# Patient Record
Sex: Female | Born: 1935 | Race: White | Hispanic: No | Smoking: Never smoker
Health system: Southern US, Community
[De-identification: ages and names within clinical notes are randomized; demographics above are authoritative.]

## PROBLEM LIST (undated history)

## (undated) DIAGNOSIS — N189 Chronic kidney disease, unspecified: Secondary | ICD-10-CM

## (undated) DIAGNOSIS — I959 Hypotension, unspecified: Secondary | ICD-10-CM

## (undated) DIAGNOSIS — I519 Heart disease, unspecified: Secondary | ICD-10-CM

## (undated) DIAGNOSIS — E785 Hyperlipidemia, unspecified: Secondary | ICD-10-CM

## (undated) DIAGNOSIS — N179 Acute kidney failure, unspecified: Secondary | ICD-10-CM

## (undated) DIAGNOSIS — Z780 Asymptomatic menopausal state: Secondary | ICD-10-CM

## (undated) DIAGNOSIS — I509 Heart failure, unspecified: Secondary | ICD-10-CM

## (undated) DIAGNOSIS — J302 Other seasonal allergic rhinitis: Secondary | ICD-10-CM

## (undated) DIAGNOSIS — E119 Type 2 diabetes mellitus without complications: Secondary | ICD-10-CM

## (undated) DIAGNOSIS — F419 Anxiety disorder, unspecified: Secondary | ICD-10-CM

## (undated) DIAGNOSIS — I10 Essential (primary) hypertension: Secondary | ICD-10-CM

## (undated) DIAGNOSIS — M797 Fibromyalgia: Secondary | ICD-10-CM

## (undated) DIAGNOSIS — G4700 Insomnia, unspecified: Secondary | ICD-10-CM

## (undated) DIAGNOSIS — I2510 Atherosclerotic heart disease of native coronary artery without angina pectoris: Secondary | ICD-10-CM

## (undated) DIAGNOSIS — N905 Atrophy of vulva: Secondary | ICD-10-CM

## (undated) DIAGNOSIS — R609 Edema, unspecified: Secondary | ICD-10-CM

## (undated) DIAGNOSIS — R634 Abnormal weight loss: Secondary | ICD-10-CM

## (undated) DIAGNOSIS — I5043 Acute on chronic combined systolic (congestive) and diastolic (congestive) heart failure: Secondary | ICD-10-CM

## (undated) DIAGNOSIS — I219 Acute myocardial infarction, unspecified: Secondary | ICD-10-CM

## (undated) DIAGNOSIS — F32A Depression, unspecified: Secondary | ICD-10-CM

## (undated) DIAGNOSIS — G43909 Migraine, unspecified, not intractable, without status migrainosus: Secondary | ICD-10-CM

## (undated) DIAGNOSIS — F329 Major depressive disorder, single episode, unspecified: Secondary | ICD-10-CM

## (undated) DIAGNOSIS — D649 Anemia, unspecified: Secondary | ICD-10-CM

## (undated) HISTORY — PX: CARDIAC CATHETERIZATION: SHX172

## (undated) HISTORY — DX: Asymptomatic menopausal state: Z78.0

## (undated) HISTORY — DX: Anemia, unspecified: D64.9

## (undated) HISTORY — DX: Hypotension, unspecified: I95.9

## (undated) HISTORY — DX: Acute kidney failure, unspecified: N17.9

## (undated) HISTORY — DX: Chronic kidney disease, unspecified: N18.9

## (undated) HISTORY — DX: Heart disease, unspecified: I51.9

## (undated) HISTORY — DX: Atrophy of vulva: N90.5

## (undated) HISTORY — DX: Edema, unspecified: R60.9

## (undated) HISTORY — DX: Other seasonal allergic rhinitis: J30.2

## (undated) HISTORY — DX: Abnormal weight loss: R63.4

## (undated) HISTORY — DX: Acute on chronic combined systolic (congestive) and diastolic (congestive) heart failure: I50.43

## (undated) HISTORY — PX: CORONARY ANGIOPLASTY: SHX604

## (undated) HISTORY — PX: CATARACT EXTRACTION W/ INTRAOCULAR LENS  IMPLANT, BILATERAL: SHX1307

## (undated) HISTORY — DX: Insomnia, unspecified: G47.00

---

## 1975-04-11 HISTORY — PX: VAGINAL HYSTERECTOMY: SUR661

## 1986-04-10 HISTORY — PX: CHOLECYSTECTOMY OPEN: SUR202

## 2004-04-10 HISTORY — PX: CORONARY ARTERY BYPASS GRAFT: SHX141

## 2015-01-19 LAB — CBC AND DIFFERENTIAL
HEMATOCRIT: 36 % (ref 36–46)
HEMOGLOBIN: 12 g/dL (ref 12.0–16.0)
PLATELETS: 183 10*3/uL (ref 150–399)
WBC: 8.9 10*3/mL

## 2015-01-19 LAB — BASIC METABOLIC PANEL
BUN: 36 mg/dL — AB (ref 4–21)
CREATININE: 1.2 mg/dL — AB (ref 0.5–1.1)
POTASSIUM: 4.5 mmol/L (ref 3.4–5.3)
Potassium: 4.5 mmol/L (ref 3.4–5.3)
Potassium: 4.5 mmol/L (ref 3.4–5.3)
Sodium: 136 mmol/L — AB (ref 137–147)

## 2015-01-19 LAB — HEMOGLOBIN A1C
HEMOGLOBIN A1C: 8.6
Hemoglobin A1C: 8.6

## 2015-04-21 ENCOUNTER — Other Ambulatory Visit: Payer: Self-pay

## 2015-04-21 ENCOUNTER — Emergency Department (HOSPITAL_COMMUNITY): Admit: 2015-04-21 | Discharge: 2015-04-21 | Disposition: A | Discharge status: Home / Self Care | Payer: Medicare Other

## 2015-04-21 ENCOUNTER — Inpatient Hospital Stay (HOSPITAL_COMMUNITY)
Admission: EM | Admit: 2015-04-21 | Discharge: 2015-05-06 | DRG: 291 | Disposition: A | Discharge status: Hospice | Payer: Medicare Other | Attending: Internal Medicine | Admitting: Internal Medicine

## 2015-04-21 ENCOUNTER — Encounter (HOSPITAL_COMMUNITY): Payer: Self-pay | Admitting: Emergency Medicine

## 2015-04-21 ENCOUNTER — Emergency Department (HOSPITAL_COMMUNITY): Payer: Medicare Other

## 2015-04-21 DIAGNOSIS — E876 Hypokalemia: Secondary | ICD-10-CM | POA: Insufficient documentation

## 2015-04-21 DIAGNOSIS — E785 Hyperlipidemia, unspecified: Secondary | ICD-10-CM | POA: Diagnosis present

## 2015-04-21 DIAGNOSIS — E119 Type 2 diabetes mellitus without complications: Secondary | ICD-10-CM

## 2015-04-21 DIAGNOSIS — E871 Hypo-osmolality and hyponatremia: Secondary | ICD-10-CM | POA: Diagnosis not present

## 2015-04-21 DIAGNOSIS — R52 Pain, unspecified: Secondary | ICD-10-CM | POA: Insufficient documentation

## 2015-04-21 DIAGNOSIS — E1122 Type 2 diabetes mellitus with diabetic chronic kidney disease: Secondary | ICD-10-CM | POA: Diagnosis present

## 2015-04-21 DIAGNOSIS — Z794 Long term (current) use of insulin: Secondary | ICD-10-CM

## 2015-04-21 DIAGNOSIS — I472 Ventricular tachycardia: Secondary | ICD-10-CM | POA: Diagnosis not present

## 2015-04-21 DIAGNOSIS — Z66 Do not resuscitate: Secondary | ICD-10-CM | POA: Diagnosis present

## 2015-04-21 DIAGNOSIS — M25551 Pain in right hip: Secondary | ICD-10-CM | POA: Diagnosis present

## 2015-04-21 DIAGNOSIS — N189 Chronic kidney disease, unspecified: Secondary | ICD-10-CM

## 2015-04-21 DIAGNOSIS — N289 Disorder of kidney and ureter, unspecified: Secondary | ICD-10-CM | POA: Diagnosis present

## 2015-04-21 DIAGNOSIS — I5043 Acute on chronic combined systolic (congestive) and diastolic (congestive) heart failure: Secondary | ICD-10-CM | POA: Diagnosis present

## 2015-04-21 DIAGNOSIS — I5023 Acute on chronic systolic (congestive) heart failure: Secondary | ICD-10-CM | POA: Insufficient documentation

## 2015-04-21 DIAGNOSIS — I872 Venous insufficiency (chronic) (peripheral): Secondary | ICD-10-CM | POA: Diagnosis present

## 2015-04-21 DIAGNOSIS — D696 Thrombocytopenia, unspecified: Secondary | ICD-10-CM | POA: Diagnosis present

## 2015-04-21 DIAGNOSIS — F329 Major depressive disorder, single episode, unspecified: Secondary | ICD-10-CM | POA: Diagnosis present

## 2015-04-21 DIAGNOSIS — K7460 Unspecified cirrhosis of liver: Secondary | ICD-10-CM | POA: Diagnosis present

## 2015-04-21 DIAGNOSIS — I248 Other forms of acute ischemic heart disease: Secondary | ICD-10-CM | POA: Diagnosis present

## 2015-04-21 DIAGNOSIS — G8929 Other chronic pain: Secondary | ICD-10-CM | POA: Diagnosis present

## 2015-04-21 DIAGNOSIS — N179 Acute kidney failure, unspecified: Secondary | ICD-10-CM | POA: Diagnosis present

## 2015-04-21 DIAGNOSIS — I2510 Atherosclerotic heart disease of native coronary artery without angina pectoris: Secondary | ICD-10-CM | POA: Diagnosis present

## 2015-04-21 DIAGNOSIS — Z7189 Other specified counseling: Secondary | ICD-10-CM | POA: Diagnosis not present

## 2015-04-21 DIAGNOSIS — I255 Ischemic cardiomyopathy: Secondary | ICD-10-CM | POA: Diagnosis present

## 2015-04-21 DIAGNOSIS — M79604 Pain in right leg: Secondary | ICD-10-CM

## 2015-04-21 DIAGNOSIS — R059 Cough, unspecified: Secondary | ICD-10-CM

## 2015-04-21 DIAGNOSIS — I130 Hypertensive heart and chronic kidney disease with heart failure and stage 1 through stage 4 chronic kidney disease, or unspecified chronic kidney disease: Secondary | ICD-10-CM | POA: Diagnosis present

## 2015-04-21 DIAGNOSIS — I252 Old myocardial infarction: Secondary | ICD-10-CM

## 2015-04-21 DIAGNOSIS — N183 Chronic kidney disease, stage 3 (moderate): Secondary | ICD-10-CM | POA: Diagnosis present

## 2015-04-21 DIAGNOSIS — Z951 Presence of aortocoronary bypass graft: Secondary | ICD-10-CM

## 2015-04-21 DIAGNOSIS — M79606 Pain in leg, unspecified: Secondary | ICD-10-CM | POA: Diagnosis present

## 2015-04-21 DIAGNOSIS — I959 Hypotension, unspecified: Secondary | ICD-10-CM | POA: Diagnosis not present

## 2015-04-21 DIAGNOSIS — R0600 Dyspnea, unspecified: Secondary | ICD-10-CM | POA: Diagnosis not present

## 2015-04-21 DIAGNOSIS — Z7982 Long term (current) use of aspirin: Secondary | ICD-10-CM | POA: Diagnosis not present

## 2015-04-21 DIAGNOSIS — R778 Other specified abnormalities of plasma proteins: Secondary | ICD-10-CM

## 2015-04-21 DIAGNOSIS — Z515 Encounter for palliative care: Secondary | ICD-10-CM | POA: Diagnosis present

## 2015-04-21 DIAGNOSIS — K5900 Constipation, unspecified: Secondary | ICD-10-CM | POA: Diagnosis present

## 2015-04-21 DIAGNOSIS — I509 Heart failure, unspecified: Secondary | ICD-10-CM | POA: Diagnosis not present

## 2015-04-21 DIAGNOSIS — R627 Adult failure to thrive: Secondary | ICD-10-CM | POA: Diagnosis not present

## 2015-04-21 DIAGNOSIS — K5909 Other constipation: Secondary | ICD-10-CM | POA: Diagnosis not present

## 2015-04-21 DIAGNOSIS — E8809 Other disorders of plasma-protein metabolism, not elsewhere classified: Secondary | ICD-10-CM | POA: Diagnosis present

## 2015-04-21 DIAGNOSIS — M7989 Other specified soft tissue disorders: Secondary | ICD-10-CM | POA: Diagnosis present

## 2015-04-21 DIAGNOSIS — R040 Epistaxis: Secondary | ICD-10-CM | POA: Diagnosis not present

## 2015-04-21 DIAGNOSIS — R05 Cough: Secondary | ICD-10-CM

## 2015-04-21 DIAGNOSIS — R600 Localized edema: Secondary | ICD-10-CM | POA: Diagnosis not present

## 2015-04-21 DIAGNOSIS — N184 Chronic kidney disease, stage 4 (severe): Secondary | ICD-10-CM | POA: Diagnosis not present

## 2015-04-21 DIAGNOSIS — R7989 Other specified abnormal findings of blood chemistry: Secondary | ICD-10-CM

## 2015-04-21 HISTORY — DX: Migraine, unspecified, not intractable, without status migrainosus: G43.909

## 2015-04-21 HISTORY — DX: Heart failure, unspecified: I50.9

## 2015-04-21 HISTORY — DX: Atherosclerotic heart disease of native coronary artery without angina pectoris: I25.10

## 2015-04-21 HISTORY — DX: Essential (primary) hypertension: I10

## 2015-04-21 HISTORY — DX: Type 2 diabetes mellitus without complications: E11.9

## 2015-04-21 HISTORY — DX: Major depressive disorder, single episode, unspecified: F32.9

## 2015-04-21 HISTORY — DX: Acute myocardial infarction, unspecified: I21.9

## 2015-04-21 HISTORY — DX: Fibromyalgia: M79.7

## 2015-04-21 HISTORY — DX: Depression, unspecified: F32.A

## 2015-04-21 HISTORY — DX: Anxiety disorder, unspecified: F41.9

## 2015-04-21 HISTORY — DX: Hyperlipidemia, unspecified: E78.5

## 2015-04-21 LAB — CBC WITH DIFFERENTIAL/PLATELET
BASOS ABS: 0 10*3/uL (ref 0.0–0.1)
BASOS PCT: 0 %
EOS ABS: 0.2 10*3/uL (ref 0.0–0.7)
Eosinophils Relative: 2 %
HEMATOCRIT: 40.9 % (ref 36.0–46.0)
HEMOGLOBIN: 12.9 g/dL (ref 12.0–15.0)
Lymphocytes Relative: 20 %
Lymphs Abs: 1.5 10*3/uL (ref 0.7–4.0)
MCH: 28.2 pg (ref 26.0–34.0)
MCHC: 31.5 g/dL (ref 30.0–36.0)
MCV: 89.5 fL (ref 78.0–100.0)
MONO ABS: 0.5 10*3/uL (ref 0.1–1.0)
Monocytes Relative: 7 %
NEUTROS ABS: 5.5 10*3/uL (ref 1.7–7.7)
NEUTROS PCT: 71 %
Platelets: 172 10*3/uL (ref 150–400)
RBC: 4.57 MIL/uL (ref 3.87–5.11)
RDW: 14.8 % (ref 11.5–15.5)
WBC: 7.7 10*3/uL (ref 4.0–10.5)

## 2015-04-21 LAB — BASIC METABOLIC PANEL
ANION GAP: 10 (ref 5–15)
BUN: 49 mg/dL — ABNORMAL HIGH (ref 6–20)
CALCIUM: 9.2 mg/dL (ref 8.9–10.3)
CO2: 22 mmol/L (ref 22–32)
CREATININE: 1.43 mg/dL — AB (ref 0.44–1.00)
Chloride: 105 mmol/L (ref 101–111)
GFR, EST AFRICAN AMERICAN: 39 mL/min — AB (ref >60)
GFR, EST NON AFRICAN AMERICAN: 33 mL/min — AB (ref >60)
Glucose, Bld: 247 mg/dL — ABNORMAL HIGH (ref 65–99)
Potassium: 4.4 mmol/L (ref 3.5–5.1)
SODIUM: 137 mmol/L (ref 135–145)

## 2015-04-21 LAB — I-STAT TROPONIN, ED: TROPONIN I, POC: 0.13 ng/mL — AB (ref 0.00–0.08)

## 2015-04-21 LAB — GLUCOSE, CAPILLARY: GLUCOSE-CAPILLARY: 219 mg/dL — AB (ref 65–99)

## 2015-04-21 LAB — BRAIN NATRIURETIC PEPTIDE

## 2015-04-21 MED ORDER — INSULIN GLARGINE 100 UNIT/ML ~~LOC~~ SOLN
12.0000 [IU] | Freq: Every morning | SUBCUTANEOUS | Status: DC
  Administered 2015-04-22 – 2015-05-05 (×13): 12 [IU] via SUBCUTANEOUS
  Filled 2015-04-21 (×17): qty 0.12

## 2015-04-21 MED ORDER — ACETAMINOPHEN 325 MG PO TABS
650.0000 mg | ORAL_TABLET | ORAL | Status: DC | PRN
  Administered 2015-04-22 – 2015-04-28 (×6): 650 mg via ORAL
  Filled 2015-04-21 (×5): qty 2

## 2015-04-21 MED ORDER — SODIUM CHLORIDE 0.9 % IV SOLN: 250.0000 mL | INTRAVENOUS | Status: DC | PRN

## 2015-04-21 MED ORDER — ACETAMINOPHEN 500 MG PO TABS: 1000.0000 mg | ORAL_TABLET | Freq: Four times a day (QID) | ORAL | Status: DC | PRN

## 2015-04-21 MED ORDER — CITALOPRAM HYDROBROMIDE 10 MG PO TABS
10.0000 mg | ORAL_TABLET | Freq: Every day | ORAL | Status: DC
  Administered 2015-04-21 – 2015-05-05 (×14): 10 mg via ORAL
  Filled 2015-04-21 (×15): qty 1

## 2015-04-21 MED ORDER — SODIUM CHLORIDE 0.9 % IJ SOLN
3.0000 mL | Freq: Two times a day (BID) | INTRAMUSCULAR | Status: DC
  Administered 2015-04-21 – 2015-04-28 (×13): 3 mL via INTRAVENOUS

## 2015-04-21 MED ORDER — FERROUS SULFATE 325 (65 FE) MG PO TABS
325.0000 mg | ORAL_TABLET | Freq: Two times a day (BID) | ORAL | Status: DC
  Administered 2015-04-21 – 2015-05-06 (×28): 325 mg via ORAL
  Filled 2015-04-21 (×30): qty 1

## 2015-04-21 MED ORDER — ONDANSETRON HCL 4 MG/2ML IJ SOLN
4.0000 mg | Freq: Four times a day (QID) | INTRAMUSCULAR | Status: DC | PRN
  Administered 2015-04-21 – 2015-05-04 (×8): 4 mg via INTRAVENOUS
  Filled 2015-04-21 (×7): qty 2

## 2015-04-21 MED ORDER — HEPARIN SODIUM (PORCINE) 5000 UNIT/ML IJ SOLN
5000.0000 [IU] | Freq: Three times a day (TID) | INTRAMUSCULAR | Status: DC
  Administered 2015-04-21 – 2015-04-24 (×10): 5000 [IU] via SUBCUTANEOUS
  Filled 2015-04-21 (×12): qty 1

## 2015-04-21 MED ORDER — FUROSEMIDE 10 MG/ML IJ SOLN
40.0000 mg | Freq: Once | INTRAMUSCULAR | Status: AC
  Administered 2015-04-21: 40 mg via INTRAVENOUS
  Filled 2015-04-21: qty 4

## 2015-04-21 MED ORDER — ASPIRIN EC 81 MG PO TBEC
81.0000 mg | DELAYED_RELEASE_TABLET | Freq: Every day | ORAL | Status: DC
  Administered 2015-04-21 – 2015-05-05 (×14): 81 mg via ORAL
  Filled 2015-04-21 (×15): qty 1

## 2015-04-21 MED ORDER — FUROSEMIDE 10 MG/ML IJ SOLN
40.0000 mg | Freq: Two times a day (BID) | INTRAMUSCULAR | Status: DC
  Administered 2015-04-22 – 2015-04-24 (×6): 40 mg via INTRAVENOUS
  Filled 2015-04-21 (×6): qty 4

## 2015-04-21 MED ORDER — NITROGLYCERIN 2 % TD OINT
1.0000 [in_us] | TOPICAL_OINTMENT | Freq: Once | TRANSDERMAL | Status: AC
  Administered 2015-04-21: 1 [in_us] via TOPICAL
  Filled 2015-04-21: qty 1

## 2015-04-21 MED ORDER — SODIUM CHLORIDE 0.9 % IJ SOLN: 3.0000 mL | INTRAMUSCULAR | Status: DC | PRN

## 2015-04-21 MED ORDER — FENTANYL CITRATE (PF) 100 MCG/2ML IJ SOLN
50.0000 ug | Freq: Once | INTRAMUSCULAR | Status: AC
  Administered 2015-04-21: 50 ug via INTRAVENOUS
  Filled 2015-04-21: qty 2

## 2015-04-21 MED ORDER — ROSUVASTATIN CALCIUM 10 MG PO TABS
10.0000 mg | ORAL_TABLET | Freq: Every evening | ORAL | Status: DC
  Administered 2015-04-21: 10 mg via ORAL
  Filled 2015-04-21: qty 1

## 2015-04-21 MED ORDER — ALIGN 4 MG PO CAPS: 4.0000 mg | ORAL_CAPSULE | Freq: Every day | ORAL | Status: DC

## 2015-04-21 NOTE — ED Provider Notes (Signed)
Patient complains of pain in her right leg below the knee for the past several months.. Today her right leg her to the point where she could not walk. Pain is worse with walking or weightbearing. Her son reports that both legs have been swollen for several months and actually look much improved. The right leg has been present for several months and looks much improved. Patient complained of mild shortness of breath earlier today but denies shortness of breath presently. On exam alert appropriate lungs HEENT exam no facial asymmetry neck supple no JVD no bruits lungs clear auscultation heart regular rate and rhythm abdomen nondistended nontender. Bilateral lower extremities with 2+ pretibial pitting edema. Right lower extremity is reddened below the knee not hot or cold as compared to left leg. Both legs are hairless, skin is thin. DP pulses absent bilaterally. Suspect the patient has chronic vascular insufficiency  Doug SouSam Irianna Gilday, MD 04/22/15 [phone]

## 2015-04-21 NOTE — ED Provider Notes (Signed)
CSN: [id number]     Arrival date & time 04/21/15  1751 History   First MD Initiated Contact with Patient 04/21/15 1805     Chief Complaint  Patient presents with  . Leg Pain     (Consider location/radiation/quality/duration/timing/severity/associated sxs/prior Treatment) HPI Comments: Patient with history of congestive heart failure on Lasix, diabetes, high blood pressure -- presents worsening of her baseline lower extremity swelling and leg pain today. Family notes that her symptoms were much better yesterday and in general, her legs are less swollen now than they have been over the past several weeks. Patient has chronic redness to her right leg. Today her legs hurt to the point where she could not walk. No fever, N/V/D, CP. No reported h/o DVT. Patient sleeps on 1 pillow. Pt reports having worsening SOB today. No change in urination. [place] The onset of this condition was acute on chronic. The course is constant. Aggravating factors: none. Alleviating factors: none.    The history is provided by the patient and a relative.    Past Medical History  Diagnosis Date  . CHF (congestive heart failure) (HCC)   . Diabetes mellitus without complication (HCC)   . Hypertension    History reviewed. No pertinent past surgical history. No family history on file. Social History  Substance Use Topics  . Smoking status: None  . Smokeless tobacco: None  . Alcohol Use: None   OB History    No data available     Review of Systems  Constitutional: Negative for fever.  HENT: Negative for rhinorrhea and sore throat.   Eyes: Negative for redness.  Respiratory: Positive for shortness of breath. Negative for cough.   Cardiovascular: Positive for leg swelling. Negative for chest pain.  Gastrointestinal: Negative for nausea, vomiting, abdominal pain and diarrhea.  Genitourinary: Negative for dysuria.  Musculoskeletal: Positive for myalgias.  Skin: Positive for color  change. Negative for rash.  Neurological: Negative for headaches.    Allergies  Codeine and Lipitor  Home Medications   Prior to Admission medications   Not on File   BP 124/88 mmHg  Pulse 90  Resp 20  SpO2 100%   Physical Exam  Constitutional: She appears well-developed and well-nourished.  HENT:  Head: Normocephalic and atraumatic.  Eyes: Conjunctivae are normal. Right eye exhibits no discharge. Left eye exhibits no discharge.  Neck: Normal range of motion. Neck supple.  Cardiovascular: Normal rate, regular rhythm and normal heart sounds.   No murmur heard. Pulmonary/Chest: Effort normal and breath sounds normal. No respiratory distress. She has no wheezes. She has no rales.  Abdominal: Soft. There is no tenderness.  Musculoskeletal:  Bilateral LE edema, R>L. Erythema of R lower leg.   Neurological: She is alert.  Skin: Skin is warm and dry.  Psychiatric: She has a normal mood and affect.  Nursing note and vitals reviewed.   ED Course  Procedures (including critical care time) Labs Review Labs Reviewed  BASIC METABOLIC PANEL - Abnormal; Notable for the following:    Glucose, Bld 247 (*)    BUN 49 (*)    Creatinine, Ser 1.43 (*)    GFR calc non Af Amer 33 (*)    GFR calc Af Amer 39 (*)    All other components within normal limits  BRAIN NATRIURETIC PEPTIDE - Abnormal; Notable for the following:    B Natriuretic Peptide >4500.0 (*)    All other components within normal limits  I-STAT TROPOININ, ED - Abnormal; Notable  for the following:    Troponin i, poc 0.13 (*)    All other components within normal limits  CBC WITH DIFFERENTIAL/PLATELET  TROPONIN I  TROPONIN I  TROPONIN I  BASIC METABOLIC PANEL    Imaging Review Dg Chest 2 View  04/21/2015  CLINICAL DATA:  Lower extremity swelling and edema for 2 months. EXAM: CHEST  2 VIEW COMPARISON:  None. FINDINGS: Low lung volumes. The lungs are clear wiithout focal pneumonia, edema, pneumothorax or pleural  effusion. Interstitial markings are diffusely coarsened with chronic features. There is pulmonary vascular congestion without overt pulmonary edema. The cardio pericardial silhouette is enlarged. Patient is status post median sternotomy Bones are diffusely demineralized. IMPRESSION: Enlargement of the cardiopericardial silhouette. Cardiomegaly versus pericardial effusion. Vascular congestion without overt edema. Electronically Signed   By: Kennith Center M.D.   On: 04/21/2015 19:28   I have personally reviewed and evaluated these images and lab results as part of my medical decision-making.   EKG Interpretation None       6:24 PM Patient seen and examined. Work-up initiated. Medications ordered.   Vital signs reviewed and are as follows: BP 124/88 mmHg  Pulse 90  Resp 20  SpO2 100%  9:09 PM Labs as above. Admit for CHF exacerbation.   D/w Dr. Ethelda Chick who has seen. Spoke with Dr. Julian Reil who will admit.   ED ECG REPORT   Date: 04/21/2015  Rate: 87  Rhythm: normal sinus rhythm  QRS Axis: left  Intervals: normal  ST/T Wave abnormalities: nonspecific T wave changes  Conduction Disutrbances:none  Narrative Interpretation:   Old EKG Reviewed: none available  I have personally reviewed the EKG tracing and agree with the computerized printout as noted.    MDM   Final diagnoses:  Acute on chronic congestive heart failure, unspecified congestive heart failure type (HCC)  Elevated troponin   Admit.    Renne Crigler, PA-C 04/21/15 2127  Renne Crigler, PA-C 04/21/15 2133  Doug Sou, MD 04/22/15 0040

## 2015-04-21 NOTE — ED Notes (Signed)
Report attempted. RN stated that she was not ready for patient at this time.

## 2015-04-21 NOTE — H&P (Signed)
[facility] History and Physical  Starletta Houchin ZOX:[id number] DOB: 1931/11/02 DOA: 04/21/2015  Referring physician: EDP PCP: No primary care provider on file.   Chief Complaint: Fatigue   HPI: Angela Stuart is a 82 y.o. female with h/o CHF, CABG, DM, HTN.  Patient presents to the ED with several week history of worsening peripheral edema and generalized weakness and fatigue.  This became severe enough today that she could not walk per family.  She recently moved to area from [place].  Symptoms are not being helped by her home Lasix which she takes once a day at noon (dose unknown).  She denies any chest pain, no change in urination.  Review of Systems: Systems reviewed.  As above, otherwise negative  Past Medical History  Diagnosis Date  . CHF (congestive heart failure) (HCC)   . Diabetes mellitus without complication (HCC)   . Hypertension    Past Surgical History  Procedure Laterality Date  . Coronary artery bypass graft     Social History:  has no tobacco, alcohol, and drug history on file.  Allergies  Allergen Reactions  . Codeine Nausea And Vomiting and Other (See Comments)    headaches  . Lipitor [Atorvastatin] Nausea And Vomiting and Other (See Comments)    headaches    No family history on file.   Prior to Admission medications   Medication Sig Start Date End Date Taking? Authorizing Provider  acetaminophen (TYLENOL) 500 MG tablet Take 1,000 mg by mouth every 6 (six) hours as needed for moderate pain.   Yes Historical Provider, MD  aspirin EC 81 MG tablet Take 81 mg by mouth daily.   Yes Historical Provider, MD  citalopram (CELEXA) 10 MG tablet Take 10 mg by mouth daily.   Yes Historical Provider, MD  ferrous sulfate 325 (65 FE) MG EC tablet Take 325 mg by mouth 2 (two) times daily.   Yes Historical Provider, MD  hydrocortisone cream 1 % Apply 1 application topically daily as needed for itching.   Yes Historical Provider, MD  insulin glargine (LANTUS)  100 UNIT/ML injection Inject 12 Units into the skin every morning.   Yes Historical Provider, MD  metFORMIN (GLUCOPHAGE) 500 MG tablet Take 500 mg by mouth 2 (two) times daily with a meal.   Yes Historical Provider, MD  Probiotic Product (ALIGN) 4 MG CAPS Take 4 mg by mouth daily.   Yes Historical Provider, MD  rosuvastatin (CRESTOR) 10 MG tablet Take 10 mg by mouth every evening.   Yes Historical Provider, MD   Physical Exam: Filed Vitals:   04/21/15 2045 04/21/15 2100  BP: 124/88 125/86  Pulse: 90 90  Resp:      BP 125/86 mmHg  Pulse 90  Resp 20  SpO2 99%  General Appearance:    Alert, oriented, no distress, appears stated age  Head:    Normocephalic, atraumatic  Eyes:    PERRL, EOMI, sclera non-icteric        Nose:   Nares without drainage or epistaxis. Mucosa, turbinates normal  Throat:   Moist mucous membranes. Oropharynx without erythema or exudate.  Neck:   Supple. No carotid bruits.  No thyromegaly.  No lymphadenopathy.   Back:     No CVA tenderness, no spinal tenderness  Lungs:     Clear to auscultation bilaterally, without wheezes, rhonchi or rales  Chest wall:    No tenderness to palpitation  Heart:    Regular rate and rhythm without murmurs, gallops, rubs  Abdomen:  Soft, non-tender, nondistended, normal bowel sounds, no organomegaly  Genitalia:    deferred  Rectal:    deferred  Extremities:   No clubbing, cyanosis or edema.  Pulses:   2+ and symmetric all extremities  Skin:   Skin color, texture, turgor normal, no rashes or lesions  Lymph nodes:   Cervical, supraclavicular, and axillary nodes normal  Neurologic:   CNII-XII intact. Normal strength, sensation and reflexes      throughout    Labs on Admission:  Basic Metabolic Panel:  Recent Labs Lab 04/21/15 1938  NA 137  K 4.4  CL 105  CO2 22  GLUCOSE 247*  BUN 49*  CREATININE 1.43*  CALCIUM 9.2   Liver Function Tests: No results for input(s): AST, ALT, ALKPHOS, BILITOT, PROT, ALBUMIN in the last  168 hours. No results for input(s): LIPASE, AMYLASE in the last 168 hours. No results for input(s): AMMONIA in the last 168 hours. CBC:  Recent Labs Lab 04/21/15 1938  WBC 7.7  NEUTROABS 5.5  HGB 12.9  HCT 40.9  MCV 89.5  PLT 172   Cardiac Enzymes: No results for input(s): CKTOTAL, CKMB, CKMBINDEX, TROPONINI in the last 168 hours.  BNP (last 3 results) No results for input(s): PROBNP in the last 8760 hours. CBG: No results for input(s): GLUCAP in the last 168 hours.  Radiological Exams on Admission: Dg Chest 2 View  04/21/2015  CLINICAL DATA:  Lower extremity swelling and edema for 2 months. EXAM: CHEST  2 VIEW COMPARISON:  None. FINDINGS: Low lung volumes. The lungs are clear wiithout focal pneumonia, edema, pneumothorax or pleural effusion. Interstitial markings are diffusely coarsened with chronic features. There is pulmonary vascular congestion without overt pulmonary edema. The cardio pericardial silhouette is enlarged. Patient is status post median sternotomy Bones are diffusely demineralized. IMPRESSION: Enlargement of the cardiopericardial silhouette. Cardiomegaly versus pericardial effusion. Vascular congestion without overt edema. Electronically Signed   By: Kennith CenterEric  Mansell M.D.   On: 04/21/2015 19:28    EKG: Independently reviewed.  Assessment/Plan Principal Problem:   CHF, acute on chronic (HCC) Active Problems:   Renal insufficiency   DM2 (diabetes mellitus, type 2) (HCC)   1. Acute on chronic CHF - unknown wether systolic / diastolic yet 1. CHF pathway 2. Obtaining outside records 3. Serial trops for slight elevation of 0.13, though this is low enough that it is likely related to CHF 4. Tele monitor 5. 2d echo pending 6. Lasix ordered 2. Renal insufficiency - 1. Not ordering ACEi for CHF 2. Monitor closely with diuresis 3. Suspect that there may be an acute component to this as patient is on Metformin at baseline 4. Hold metformin 5. Obtaining  records 3. DM2 - 1. continue home lantus 2. Hold metformin 3. CBG checks AC/HS    Code Status: Full code  Family Communication: Family at bedside Disposition Plan: Admit to inpatient   Time spent: Admit to inpatient  GARDNER, JARED M. [facility] Pager [phone]  If 7AM-7PM, please contact the day team taking care of the patient [email or web address] Password [email or web address] 04/21/2015, 9:33 PM

## 2015-04-21 NOTE — Progress Notes (Signed)
*  Preliminary Results* Right lower extremity venous duplex completed. Study was technically limited due to pitting edema. Visualized veins of the right lower extremity is negative for deep vein thrombosis. Right lower extremity veins exhibit pulsatile flow, suggestive of possible elevated right sided heart pressure. There is no evidence of right Baker's cyst.  04/21/2015 6:54 PM  Gertie FeyMichelle Marlayna Bannister, RVT, RDCS, RDMS

## 2015-04-21 NOTE — ED Notes (Signed)
Per [facility] patient complains of bilateral leg pain.  Patient reports taking a "fluid pill" but could not identify what medication she is prescribed.  Patient received 4 mg Zofran en route.

## 2015-04-22 ENCOUNTER — Encounter (HOSPITAL_COMMUNITY): Payer: Self-pay | Admitting: General Practice

## 2015-04-22 ENCOUNTER — Other Ambulatory Visit (HOSPITAL_COMMUNITY): Payer: Medicare Other

## 2015-04-22 LAB — BASIC METABOLIC PANEL
ANION GAP: 8 (ref 5–15)
BUN: 49 mg/dL — ABNORMAL HIGH (ref 6–20)
CHLORIDE: 107 mmol/L (ref 101–111)
CO2: 23 mmol/L (ref 22–32)
Calcium: 9.2 mg/dL (ref 8.9–10.3)
Creatinine, Ser: 1.53 mg/dL — ABNORMAL HIGH (ref 0.44–1.00)
GFR, EST AFRICAN AMERICAN: 36 mL/min — AB (ref >60)
GFR, EST NON AFRICAN AMERICAN: 31 mL/min — AB (ref >60)
Glucose, Bld: 259 mg/dL — ABNORMAL HIGH (ref 65–99)
POTASSIUM: 4.6 mmol/L (ref 3.5–5.1)
Sodium: 138 mmol/L (ref 135–145)

## 2015-04-22 LAB — GLUCOSE, CAPILLARY
GLUCOSE-CAPILLARY: 184 mg/dL — AB (ref 65–99)
Glucose-Capillary: 222 mg/dL — ABNORMAL HIGH (ref 65–99)

## 2015-04-22 LAB — TROPONIN I
TROPONIN I: 0.15 ng/mL — AB (ref <0.031)
TROPONIN I: 0.16 ng/mL — AB (ref <0.031)
Troponin I: 0.13 ng/mL — ABNORMAL HIGH (ref <0.031)

## 2015-04-22 LAB — MRSA PCR SCREENING: MRSA BY PCR: NEGATIVE

## 2015-04-22 MED ORDER — ROSUVASTATIN CALCIUM 10 MG PO TABS
5.0000 mg | ORAL_TABLET | Freq: Every evening | ORAL | Status: DC
  Administered 2015-04-22 – 2015-05-05 (×14): 5 mg via ORAL
  Filled 2015-04-22 (×15): qty 1

## 2015-04-22 NOTE — Progress Notes (Signed)
[facility]                                                                              Patient Demographics  Angela Stuart, is a 82 y.o. female, DOB - 29-Oct-1931, ZOX:[id number]  Admit date - 04/21/2015   Admitting Physician Angela BowJared M Gardner, DO  Outpatient Primary MD for the patient is GREEN, Angela CurtARTHUR G, MD  LOS - 1   Chief Complaint  Patient presents with  . Leg Pain      HPI on 04/21/2015 by Dr. Lyda PeroneJared Stuart Angela Stuart is a 82 y.o. female with h/o CHF, CABG, DM, HTN. Patient presents to the ED with several week history of worsening peripheral edema and generalized weakness and fatigue. This became severe enough today that she could not walk per family. [place]. Symptoms are not being helped by her home Lasix which she takes once a day at noon (dose unknown).  She denies any chest pain, no change in urination.  Assessment & Plan   Acute CHF exacerbation -Unknown if systolic or diastolic, there is no echo in our system -CXR: Enlargement of cardiopericardial silhouette, cardiomegaly versus pericardial effusion. Vascular congestion without overt edema -Pending echocardiogram -BNP >4500 -Continue lasix -Monitor daily weights, intake/output  Venous insuff. Lower extremity  -RLE appears to have erythema, patient currently afebrile, no leukocytosis. States it has been red for quite some time -Unlikely cellulitis, continue to monitor -LE Doppler: negative for DVT  Elevated troponin -Likely secondary to demand ischemia vs CHF exac -Continue to trend  Acute kidney injury vs CKD -No previous Creatinine in the system -metformin held -Continue to monitor BMP -Will attempt to contact PCP for records  Diabetes mellitus, type 2 -Continue home lantus, ISS, and CBG monitoring -Metformin held  Depression  -Continue Celexa  Hyperlipidemia -Continue statin  Code Status: Full  Family Communication: None at bedside  Disposition  Plan: Admitted  Time Spent in minutes   30 minutes  Procedures  None  Consults   None  DVT Prophylaxis  heparin  Lab Results  Component Value Date   PLT 172 04/21/2015    Medications  Scheduled Meds: . aspirin EC  81 mg Oral Daily  . citalopram  10 mg Oral Daily  . ferrous sulfate  325 mg Oral BID  . furosemide  40 mg Intravenous BID  . heparin  5,000 Units Subcutaneous 3 times per day  . insulin glargine  12 Units Subcutaneous q morning - 10a  . rosuvastatin  10 mg Oral QPM  . sodium chloride  3 mL Intravenous Q12H   Continuous Infusions:  PRN Meds:.sodium chloride, acetaminophen, ondansetron (ZOFRAN) IV, sodium chloride  Antibiotics    Anti-infectives    None      Subjective:   Angela PitmanMary Degrazia seen and examined today.  Patient feels her leg swelling as well as breathing have improved since being admitted. She denies any chest pain at this time. Denies abdominal pain, nausea, vomiting, diarrhea or constipation.    Objective:   Filed Vitals:   04/22/15 0112 04/22/15 0337 04/22/15 0846 04/22/15 1114  BP: 114/52 130/69 100/56 111/57  Pulse: 80 93 85 79  Temp: 97.5  F (36.4 C) 97.5 F (36.4 C) 97.5 F (36.4 C) 97.6 F (36.4 C)  TempSrc: Oral Oral Oral Oral  Resp: 18 20 18 18   Height:      Weight:  72.5 kg (159 lb 13.3 oz)    SpO2: 98% 100% 100% 100%    Wt Readings from Last 3 Encounters:  04/22/15 72.5 kg (159 lb 13.3 oz)     Intake/Output Summary (Last 24 hours) at 04/22/15 1318 Last data filed at 04/22/15 1025  Gross per 24 hour  Intake    600 ml  Output    275 ml  Net    325 ml    Exam  General: Well developed, well nourished, NAD, appears stated age  HEENT: NCAT, mucous membranes moist.   Cardiovascular: S1 S2 auscultated, RRR, no murmurs  Respiratory: Clear to auscultation bilaterally with equal chest rise  Abdomen: Soft, nontender, nondistended, + bowel sounds  Extremities: warm dry without cyanosis clubbing. 2+ edema in LE  B/L  Neuro: AAOx3, nonfocal  Psych: Normal affect and demeanor with intact judgement and insight  Data Review   Micro Results Recent Results (from the past 240 hour(s))  MRSA PCR Screening     Status: None   Collection Time: 04/21/15 10:55 PM  Result Value Ref Range Status   MRSA by PCR NEGATIVE NEGATIVE Final    Comment:        The GeneXpert MRSA Assay (FDA approved for NASAL specimens only), is one component of a comprehensive MRSA colonization surveillance program. It is not intended to diagnose MRSA infection nor to guide or monitor treatment for MRSA infections.     Radiology Reports Dg Chest 2 View  04/21/2015  CLINICAL DATA:  Lower extremity swelling and edema for 2 months. EXAM: CHEST  2 VIEW COMPARISON:  None. FINDINGS: Low lung volumes. The lungs are clear wiithout focal pneumonia, edema, pneumothorax or pleural effusion. Interstitial markings are diffusely coarsened with chronic features. There is pulmonary vascular congestion without overt pulmonary edema. The cardio pericardial silhouette is enlarged. Patient is status post median sternotomy Bones are diffusely demineralized. IMPRESSION: Enlargement of the cardiopericardial silhouette. Cardiomegaly versus pericardial effusion. Vascular congestion without overt edema. Electronically Signed   By: Kennith Center M.D.   On: 04/21/2015 19:28    CBC  Recent Labs Lab 04/21/15 1938  WBC 7.7  HGB 12.9  HCT 40.9  PLT 172  MCV 89.5  MCH 28.2  MCHC 31.5  RDW 14.8  LYMPHSABS 1.5  MONOABS 0.5  EOSABS 0.2  BASOSABS 0.0    Chemistries   Recent Labs Lab 04/21/15 1938 04/22/15 0323  NA 137 138  K 4.4 4.6  CL 105 107  CO2 22 23  GLUCOSE 247* 259*  BUN 49* 49*  CREATININE 1.43* 1.53*  CALCIUM 9.2 9.2   ------------------------------------------------------------------------------------------------------------------ estimated creatinine clearance is 29.4 mL/min (by C-Stuart formula based on Cr of  1.53). ------------------------------------------------------------------------------------------------------------------ No results for input(s): HGBA1C in the last 72 hours. ------------------------------------------------------------------------------------------------------------------ No results for input(s): CHOL, HDL, LDLCALC, TRIG, CHOLHDL, LDLDIRECT in the last 72 hours. ------------------------------------------------------------------------------------------------------------------ No results for input(s): TSH, T4TOTAL, T3FREE, THYROIDAB in the last 72 hours.  Invalid input(s): FREET3 ------------------------------------------------------------------------------------------------------------------ No results for input(s): VITAMINB12, FOLATE, FERRITIN, TIBC, IRON, RETICCTPCT in the last 72 hours.  Coagulation profile No results for input(s): INR, PROTIME in the last 168 hours.  No results for input(s): DDIMER in the last 72 hours.  Cardiac Enzymes  Recent Labs Lab 04/21/15 2334 04/22/15 0323 04/22/15 0935  TROPONINI 0.16*  0.15* 0.13*   ------------------------------------------------------------------------------------------------------------------ Invalid input(s): POCBNP    Kaysi Ourada D.O. on 04/22/2015 at 1:18 PM  Between 7am to 7pm - Pager - [phone]  After 7pm go to [email or web address] - password [email or web address]  And look for the night coverage person covering for me after hours  [facility] Office  [phone]

## 2015-04-22 NOTE — Progress Notes (Signed)
   04/22/15 0900  Clinical Encounter Type  Visited With Health care provider;Patient not available  Visit Type Initial  Referral From Nurse (Admit RN)  [facility] attempted to visit pt; RN in rounds and pt unavailable; If spiritual care support needed please contact Chaplain. Erline LevineMichael I Shaquisha Wynn 9:19 AM

## 2015-04-22 NOTE — Progress Notes (Signed)
Noted that blood sugars greater than 180 mg/dl. Recommend adding Novolog SENSITIVE correction scale TID & HS and check blood sugars TID & HS. Will continue to monitor blood sugars while in hospital. Smith MinceKendra Laira Penninger RN BSN CDE

## 2015-04-22 NOTE — Progress Notes (Signed)
Utilization review completed. Netasha Wehrli, RN, BSN. 

## 2015-04-23 ENCOUNTER — Other Ambulatory Visit (HOSPITAL_COMMUNITY): Payer: Medicare Other

## 2015-04-23 ENCOUNTER — Inpatient Hospital Stay (HOSPITAL_COMMUNITY): Payer: Medicare Other

## 2015-04-23 DIAGNOSIS — R0600 Dyspnea, unspecified: Secondary | ICD-10-CM

## 2015-04-23 DIAGNOSIS — I509 Heart failure, unspecified: Secondary | ICD-10-CM

## 2015-04-23 LAB — GLUCOSE, CAPILLARY
GLUCOSE-CAPILLARY: 107 mg/dL — AB (ref 65–99)
GLUCOSE-CAPILLARY: 149 mg/dL — AB (ref 65–99)
Glucose-Capillary: 86 mg/dL (ref 65–99)
Glucose-Capillary: 187 mg/dL — ABNORMAL HIGH (ref 65–99)

## 2015-04-23 LAB — CBC
HEMATOCRIT: 40.2 % (ref 36.0–46.0)
HEMOGLOBIN: 12.6 g/dL (ref 12.0–15.0)
MCH: 28.2 pg (ref 26.0–34.0)
MCHC: 31.3 g/dL (ref 30.0–36.0)
MCV: 89.9 fL (ref 78.0–100.0)
Platelets: 150 10*3/uL (ref 150–400)
RBC: 4.47 MIL/uL (ref 3.87–5.11)
RDW: 14.9 % (ref 11.5–15.5)
WBC: 7.1 10*3/uL (ref 4.0–10.5)

## 2015-04-23 LAB — BASIC METABOLIC PANEL
ANION GAP: 7 (ref 5–15)
BUN: 49 mg/dL — ABNORMAL HIGH (ref 6–20)
CO2: 23 mmol/L (ref 22–32)
Calcium: 9.1 mg/dL (ref 8.9–10.3)
Chloride: 107 mmol/L (ref 101–111)
Creatinine, Ser: 1.61 mg/dL — ABNORMAL HIGH (ref 0.44–1.00)
GFR calc Af Amer: 33 mL/min — ABNORMAL LOW (ref >60)
GFR, EST NON AFRICAN AMERICAN: 29 mL/min — AB (ref >60)
GLUCOSE: 76 mg/dL (ref 65–99)
POTASSIUM: 3.9 mmol/L (ref 3.5–5.1)
Sodium: 137 mmol/L (ref 135–145)

## 2015-04-23 LAB — MAGNESIUM: MAGNESIUM: 1.8 mg/dL (ref 1.7–2.4)

## 2015-04-23 NOTE — Progress Notes (Signed)
[facility]                                                                              Patient Demographics  Angela Stuart, is a 82 y.o. female, DOB - [birth year], ZOX:[id number]  Admit date - 04/21/2015   Admitting Physician Hillary Bow, DO  Outpatient Primary MD for the patient is GREEN, Lenon Curt, MD  LOS - 2   Chief Complaint  Patient presents with  . Leg Pain      HPI on 04/21/2015 by Dr. Lyda Perone Angela Stuart is a 82 y.o. female with h/o CHF, CABG, DM, HTN. Patient presents to the ED with several week history of worsening peripheral edema and generalized weakness and fatigue. This became severe enough today that she could not walk per family. She recently moved to area from [place]. Symptoms are not being helped by her home Lasix which she takes once a day at noon (dose unknown). She denies any chest pain, no change in urination.  Assessment & Plan   Acute CHF exacerbation -Unknown if systolic or diastolic, there is no echo in our system -CXR: Enlargement of cardiopericardial silhouette, cardiomegaly versus pericardial effusion. Vascular congestion without overt edema -Pending echocardiogram  -BNP >4500 -Continue lasix -Monitor daily weights, intake/output -Weight supposedly up by 6lbs in one day, likely error.  UOP 1130 over past 24hrs.  Venous insuff. Lower extremity  -RLE appears to have erythema, patient currently afebrile, no leukocytosis. States it has been red for quite some time -Unlikely cellulitis, continue to monitor -LE Doppler: negative for DVT  Elevated troponin -Likely secondary to demand ischemia vs CHF exac -Remaining fairly flat.  Acute kidney injury vs CKD -No previous Creatinine in the system -metformin held -Pending records -Cr 1.61 today, likely due to diuresis -Continue to monitor BMP  Diabetes mellitus, type 2 -Continue home lantus, ISS, and CBG monitoring -Metformin held  Depression  -Continue  Celexa  Hyperlipidemia -Continue statin  Code Status: Full  Family Communication: None at bedside.  Son via phone  Disposition Plan: Admitted. Pending echocardiogram  Time Spent in minutes   30 minutes  Procedures  None  Consults   None  DVT Prophylaxis  heparin  Lab Results  Component Value Date   PLT 150 04/23/2015    Medications  Scheduled Meds: . aspirin EC  81 mg Oral Daily  . citalopram  10 mg Oral Daily  . ferrous sulfate  325 mg Oral BID  . furosemide  40 mg Intravenous BID  . heparin  5,000 Units Subcutaneous 3 times per day  . insulin glargine  12 Units Subcutaneous q morning - 10a  . rosuvastatin  5 mg Oral QPM  . sodium chloride  3 mL Intravenous Q12H   Continuous Infusions:  PRN Meds:.sodium chloride, acetaminophen, ondansetron (ZOFRAN) IV, sodium chloride  Antibiotics    Anti-infectives    None      Subjective:   [name] seen and examined today.  Patient has no complaints today.  Feels her breathing and leg swelling have improved.  Patient does not know her "dry weight" or what type of CHF she has. She has never been told she has kidney disease.  Denies chest pain, abdominal pain, nausea, vomiting, diarrhea or constipation.    Objective:   Filed Vitals:   04/22/15 1114 04/22/15 2051 04/23/15 0605 04/23/15 1006  BP: 111/57 111/64 98/55   Pulse: 79 84 81   Temp: 97.6 F (36.4 C) 97.8 F (36.6 C)    TempSrc: Oral Oral    Resp: 18 17 16    Height:      Weight:   75.524 kg (166 lb 8 oz)   SpO2: 100% 99% 96% 100%    Wt Readings from Last 3 Encounters:  04/23/15 75.524 kg (166 lb 8 oz)     Intake/Output Summary (Last 24 hours) at 04/23/15 1145 Last data filed at 04/23/15 0959  Gross per 24 hour  Intake    723 ml  Output   1030 ml  Net   -307 ml    Exam  General: Well developed, well nourished, NAD  HEENT: NCAT, mucous membranes moist.   Cardiovascular: S1 S2 auscultated, RRR, soft SEM  Respiratory: Clear to  auscultation bilaterally  Abdomen: Soft, nontender, nondistended, + bowel sounds  Extremities: warm dry without cyanosis clubbing. 2+ edema in LE B/L  Neuro: AAOx3, nonfocal  Psych: Normal affect and demeanor   Data Review   Micro Results Recent Results (from the past 240 hour(s))  MRSA PCR Screening     Status: None   Collection Time: 04/21/15 10:55 PM  Result Value Ref Range Status   MRSA by PCR NEGATIVE NEGATIVE Final    Comment:        The GeneXpert MRSA Assay (FDA approved for NASAL specimens only), is one component of a comprehensive MRSA colonization surveillance program. It is not intended to diagnose MRSA infection nor to guide or monitor treatment for MRSA infections.     Radiology Reports Dg Chest 2 View  04/21/2015  CLINICAL DATA:  Lower extremity swelling and edema for 2 months. EXAM: CHEST  2 VIEW COMPARISON:  None. FINDINGS: Low lung volumes. The lungs are clear wiithout focal pneumonia, edema, pneumothorax or pleural effusion. Interstitial markings are diffusely coarsened with chronic features. There is pulmonary vascular congestion without overt pulmonary edema. The cardio pericardial silhouette is enlarged. Patient is status post median sternotomy Bones are diffusely demineralized. IMPRESSION: Enlargement of the cardiopericardial silhouette. Cardiomegaly versus pericardial effusion. Vascular congestion without overt edema. Electronically Signed   By: Kennith Center M.D.   On: 04/21/2015 19:28    CBC  Recent Labs Lab 04/21/15 1938 04/23/15 0451  WBC 7.7 7.1  HGB 12.9 12.6  HCT 40.9 40.2  PLT 172 150  MCV 89.5 89.9  MCH 28.2 28.2  MCHC 31.5 31.3  RDW 14.8 14.9  LYMPHSABS 1.5  --   MONOABS 0.5  --   EOSABS 0.2  --   BASOSABS 0.0  --     Chemistries   Recent Labs Lab 04/21/15 1938 04/22/15 0323 04/23/15 0451  NA 137 138 137  K 4.4 4.6 3.9  CL 105 107 107  CO2 22 23 23   GLUCOSE 247* 259* 76  BUN 49* 49* 49*  CREATININE 1.43* 1.53* 1.61*   CALCIUM 9.2 9.2 9.1   ------------------------------------------------------------------------------------------------------------------ estimated creatinine clearance is 28.5 mL/min (by C-G formula based on Cr of 1.61). ------------------------------------------------------------------------------------------------------------------ No results for input(s): HGBA1C in the last 72 hours. ------------------------------------------------------------------------------------------------------------------ No results for input(s): CHOL, HDL, LDLCALC, TRIG, CHOLHDL, LDLDIRECT in the last 72 hours. ------------------------------------------------------------------------------------------------------------------ No results for input(s): TSH, T4TOTAL, T3FREE, THYROIDAB in the last 72 hours.  Invalid input(s): FREET3 ------------------------------------------------------------------------------------------------------------------  No results for input(s): VITAMINB12, FOLATE, FERRITIN, TIBC, IRON, RETICCTPCT in the last 72 hours.  Coagulation profile No results for input(s): INR, PROTIME in the last 168 hours.  No results for input(s): DDIMER in the last 72 hours.  Cardiac Enzymes  Recent Labs Lab 04/21/15 2334 04/22/15 0323 04/22/15 0935  TROPONINI 0.16* 0.15* 0.13*   ------------------------------------------------------------------------------------------------------------------ Invalid input(s): POCBNP    Edwing Figley D.O. on 04/23/2015 at 11:45 AM  Between 7am to 7pm - Pager - [phone]  After 7pm go to [email or web address] - password [email or web address]  And look for the night coverage person covering for me after hours  [facility] Office  [phone]

## 2015-04-23 NOTE — Care Management Important Message (Signed)
Important Message  Patient Details  Name: Angela PitmanMary Stuart MRN: [id number] Date of Birth: Mar 12, 1932   Medicare Important Message Given:  Yes    [name] P [name] 04/23/2015, 3:08 PM

## 2015-04-23 NOTE — Research (Signed)
REDS'@discharge'  Informed Consent   Subject Name: Angela Stuart  Subject met inclusion and exclusion criteria.  The informed consent form, study requirements and expectations were reviewed with the subject and questions and concerns were addressed prior to the signing of the consent form.  The subject verbalized understanding of the trail requirements.  The subject agreed to participate in the Reds'@Discharge'  trial and signed the informed consent.  The informed consent was obtained prior to performance of any protocol-specific procedures for the subject.  A copy of the signed informed consent was given to the subject and a copy was placed in the subject's medical record.  Sandie Ano 04/23/2015, 8:40

## 2015-04-23 NOTE — Progress Notes (Signed)
Heart Failure Navigator Consult Note  Presentation: Angela Stuart  is a 82 y.o. female with h/o CHF, CABG, DM, HTN. Patient presents to the ED with several week history of worsening peripheral edema and generalized weakness and fatigue. This became severe enough today that she could not walk per family. [place] Symptoms are not being helped by her home Lasix which she takes once a day at noon.   Past Medical History  Diagnosis Date  . CHF (congestive heart failure) (HCC)   . Hypertension   . Coronary artery disease   . Hyperlipidemia   . Myocardial infarction (HCC) 1970s X1  . Type II diabetes mellitus (HCC)   . Migraine     "stopped when I had hysterectomy"  . Fibromyalgia     "feet and arms" (04/22/2015)  . Anxiety   . Depression   . Fluid retention   . Low blood pressure   . Anemia     Social History   Social History  . Marital Status: Widowed    Spouse Name: N/A  . Number of Children: N/A  . Years of Education: N/A   Social History Main Topics  . Smoking status: Never Smoker   . Smokeless tobacco: Never Used  . Alcohol Use: No  . Drug Use: No  . Sexual Activity: No   Other Topics Concern  . None   Social History Narrative   Diet:  ?   Do you drink/eat things with caffeine?  Yes - coffee   Marital status:  Widowed.  What year were you married?  1957   Do you live in a house, apartment, assisted living, condo, trailer, etc.)?  Independent living apt   Is it one or more stories?  3   How many persons live in your home?  1   Do you have any pets in your home? (please list)  No   Current or past profession:  Teacher   Do you exercise:  No, other than walking to mailbox       ECHO: pending  BNP    Component Value Date/Time   BNP >4500.0* 04/21/2015 1939    ProBNP No results found for: PROBNP   Education Assessment and Provision:  Detailed education and instructions provided on heart failure disease management  including the following:  Signs and symptoms of Heart Failure When to call the physician Importance of daily weights Low sodium diet Fluid restriction Medication management Anticipated future follow-up appointments  Patient education given on each of the above topics.  Patient acknowledges understanding and acceptance of all instructions.  I spoke with [name] and her son regarding her HF.  They tell me that she is currently living in an Independent Living facility.  She does not weigh daily and does not have a scale.  I have discussed the importance of daily weights and how weight gains relate to the signs and symptoms of HF.  Her son says that they will purchase a scale for home use.  The patients says that she eats most meals in the "dining hall" at the facility.  The son and daughter-in -law both say that the food choices seem high sodium (sausage, ham, chicken wings).  I encouraged them to speak to the facility about low sodium options.  I reviewed a low sodium diet and high sodium foods to avoid.  They deny any issues with prescribed medications and have someone who comes to "help" with medications in patient's home.  She does not currently have a cardiologist.  I also encouraged the family to make a cardiology appt at discharge if not referred here in the hospital.  They do have a previously scheduled appt with primary care next week.  Education Materials:  "Living Better With Heart Failure" Booklet, Daily Weight Tracker Tool   High Risk Criteria for Readmission and/or Poor Patient Outcome   EF <30%- pending  2 or more admissions in 6 months- No  Difficult social situation- No--currently in Independent Living Facility--?? Self care ability  Demonstrates medication noncompliance- No denies    Barriers of Care:  Knowledge and compliance --ability to provide self care.  Discharge Planning:   Plans to return to Independent Living--Could benefit from [facility] for ongoing education,  compliance reinforcement and symptom management.

## 2015-04-23 NOTE — Progress Notes (Signed)
  Echocardiogram 2D Echocardiogram has been performed.  Angela Stuart, Angela Stuart R 04/23/2015, 2:22 PM

## 2015-04-24 ENCOUNTER — Inpatient Hospital Stay (HOSPITAL_COMMUNITY): Payer: Medicare Other

## 2015-04-24 DIAGNOSIS — I5043 Acute on chronic combined systolic (congestive) and diastolic (congestive) heart failure: Secondary | ICD-10-CM

## 2015-04-24 DIAGNOSIS — K5900 Constipation, unspecified: Secondary | ICD-10-CM

## 2015-04-24 LAB — BASIC METABOLIC PANEL
Anion gap: 12 (ref 5–15)
BUN: 50 mg/dL — AB (ref 6–20)
CALCIUM: 9.1 mg/dL (ref 8.9–10.3)
CO2: 22 mmol/L (ref 22–32)
CREATININE: 1.59 mg/dL — AB (ref 0.44–1.00)
Chloride: 105 mmol/L (ref 101–111)
GFR calc Af Amer: 34 mL/min — ABNORMAL LOW (ref >60)
GFR calc non Af Amer: 29 mL/min — ABNORMAL LOW (ref >60)
GLUCOSE: 153 mg/dL — AB (ref 65–99)
Potassium: 3.8 mmol/L (ref 3.5–5.1)
Sodium: 139 mmol/L (ref 135–145)

## 2015-04-24 LAB — HEPATIC FUNCTION PANEL
ALBUMIN: 3 g/dL — AB (ref 3.5–5.0)
ALK PHOS: 70 U/L (ref 38–126)
ALT: 13 U/L — ABNORMAL LOW (ref 14–54)
AST: 27 U/L (ref 15–41)
Bilirubin, Direct: 0.4 mg/dL (ref 0.1–0.5)
Indirect Bilirubin: 0.8 mg/dL (ref 0.3–0.9)
TOTAL PROTEIN: 6.3 g/dL — AB (ref 6.5–8.1)
Total Bilirubin: 1.2 mg/dL (ref 0.3–1.2)

## 2015-04-24 LAB — GLUCOSE, CAPILLARY
GLUCOSE-CAPILLARY: 188 mg/dL — AB (ref 65–99)
GLUCOSE-CAPILLARY: 228 mg/dL — AB (ref 65–99)
Glucose-Capillary: 134 mg/dL — ABNORMAL HIGH (ref 65–99)
Glucose-Capillary: 164 mg/dL — ABNORMAL HIGH (ref 65–99)

## 2015-04-24 LAB — TSH: TSH: 5.028 u[IU]/mL — AB (ref 0.350–4.500)

## 2015-04-24 MED ORDER — ALBUMIN HUMAN 25 % IV SOLN
12.5000 g | Freq: Once | INTRAVENOUS | Status: AC
  Administered 2015-04-24: 12.5 g via INTRAVENOUS
  Filled 2015-04-24: qty 50

## 2015-04-24 MED ORDER — FUROSEMIDE 10 MG/ML IJ SOLN
80.0000 mg | Freq: Two times a day (BID) | INTRAMUSCULAR | Status: DC
  Administered 2015-04-25 – 2015-04-28 (×7): 80 mg via INTRAVENOUS
  Filled 2015-04-24 (×8): qty 8

## 2015-04-24 MED ORDER — POLYVINYL ALCOHOL 1.4 % OP SOLN
1.0000 [drp] | Freq: Four times a day (QID) | OPHTHALMIC | Status: DC | PRN
  Administered 2015-04-24: 1 [drp] via OPHTHALMIC
  Filled 2015-04-24: qty 15

## 2015-04-24 MED ORDER — DOXYCYCLINE HYCLATE 100 MG PO TABS
100.0000 mg | ORAL_TABLET | Freq: Two times a day (BID) | ORAL | Status: DC
  Administered 2015-04-24 – 2015-04-27 (×8): 100 mg via ORAL
  Filled 2015-04-24 (×8): qty 1

## 2015-04-24 MED ORDER — BISACODYL 10 MG RE SUPP
10.0000 mg | Freq: Once | RECTAL | Status: AC
Start: 2015-04-24 — End: 2015-04-24
  Administered 2015-04-24: 10 mg via RECTAL
  Filled 2015-04-24: qty 1

## 2015-04-24 MED ORDER — NAPHAZOLINE-GLYCERIN 0.012-0.2 % OP SOLN
1.0000 [drp] | Freq: Four times a day (QID) | OPHTHALMIC | Status: DC | PRN
Start: 2015-04-24 — End: 2015-04-24
  Filled 2015-04-24: qty 15

## 2015-04-24 NOTE — Progress Notes (Signed)
[facility]                                                                              Patient Demographics  Angela Stuart, is a 82 y.o. female, DOB - Sep 29, 1931, ZOX:[id number]  Admit date - 04/21/2015   Admitting Physician Hillary Bow, DO  Outpatient Primary MD for the patient is GREEN, Lenon Curt, MD  LOS - 3   Chief Complaint  Patient presents with  . Leg Pain      HPI on 04/21/2015 by Dr. Lyda Perone Landra Howze is a 82 y.o. female with h/o CHF, CABG, DM, HTN. Patient presents to the ED with several week history of worsening peripheral edema and generalized weakness and fatigue. This became severe enough today that she could not walk per family. She recently moved to area from [place]. Symptoms are not being helped by her home Lasix which she takes once a day at noon (dose unknown). She denies any chest pain, no change in urination.  Assessment & Plan   Acute Combined systolic/diastolic CHF exacerbation -CXR: Enlargement of cardiopericardial silhouette, cardiomegaly versus pericardial effusion. Vascular congestion without overt edema -Echocardiogram: EF 35-40%, diastolic dysfunction, Severe MR posteriorly  -BNP >4500 -Continue lasix -Monitor daily weights, intake/output -Weight supposedly up by 6lbs in one day, likely error.  UOP 825 over past 24hrs. -Cardiology consulted and appreciated -No ACEI given AKI vs CKD  Venous insuff. Lower extremity  -RLE appears to have erythema, patient currently afebrile, no leukocytosis. States it has been red for quite some time -Unlikely cellulitis, continue to monitor- erythema not improving, will try doxycycline -LE Doppler: negative for DVT  Elevated troponin -Likely secondary to demand ischemia vs CHF exac -Remaining fairly flat.  Acute kidney injury vs CKD -No previous Creatinine in the system -metformin held -Pending records -Cr 1.59 today, likely due to diuresis vs CKD -Continue to monitor BMP -Will  obtain Renal US  Diabetes mellitus, type 2 -Continue home lantus, ISS, and CBG monitoring -Metformin held -will obtain Hemoglobin A1c  Depression  -Continue Celexa  Hyperlipidemia -Continue statin  Constipation -Will order dulcolax supp  Code Status: Full  Family Communication: None at bedside.    Disposition Plan: Admitted. Pending Renal US and cardiology input  Time Spent in minutes   30 minutes  Procedures  Echocardiogram  Consults   Cardiology  DVT Prophylaxis  heparin  Lab Results  Component Value Date   PLT 150 04/23/2015    Medications  Scheduled Meds: . aspirin EC  81 mg Oral Daily  . citalopram  10 mg Oral Daily  . ferrous sulfate  325 mg Oral BID  . furosemide  40 mg Intravenous BID  . heparin  5,000 Units Subcutaneous 3 times per day  . insulin glargine  12 Units Subcutaneous q morning - 10a  . rosuvastatin  5 mg Oral QPM  . sodium chloride  3 mL Intravenous Q12H   Continuous Infusions:  PRN Meds:.sodium chloride, acetaminophen, naphazoline-glycerin, ondansetron (ZOFRAN) IV, sodium chloride  Antibiotics    Anti-infectives    None      Subjective:   Angela Stuart seen and examined today.  Patient complains of itchy eyes. Feels breathing has  improved.  Denies chest pain, abdominal pain, nausea, vomiting, diarrhea.  Complains of constipation.   Objective:   Filed Vitals:   04/23/15 0605 04/23/15 1006 04/23/15 1206 04/23/15 2015  BP: 98/55  119/67 115/86  Pulse: 81  89 96  Temp:   97.7 F (36.5 C) 97.7 F (36.5 C)  TempSrc:   Oral Oral  Resp: 16  18 18   Height:      Weight: 75.524 kg (166 lb 8 oz)     SpO2: 96% 100% 100% 99%    Wt Readings from Last 3 Encounters:  04/23/15 75.524 kg (166 lb 8 oz)     Intake/Output Summary (Last 24 hours) at 04/24/15 1012 Last data filed at 04/24/15 1005  Gross per 24 hour  Intake    603 ml  Output    900 ml  Net   -297 ml    Exam  General: Well developed, well nourished,  NAD  HEENT: NCAT, mucous membranes moist.   Cardiovascular: S1 S2 auscultated, RRR, soft SEM  Respiratory: Clear to auscultation bilaterally  Abdomen: Soft, nontender, nondistended, + bowel sounds  Extremities: warm dry without cyanosis clubbing. 2+ edema in LE B/L. Erythema RLE  Neuro: AAOx3, nonfocal  Psych: Normal affect and demeanor, pleasant   Data Review   Micro Results Recent Results (from the past 240 hour(s))  MRSA PCR Screening     Status: None   Collection Time: 04/21/15 10:55 PM  Result Value Ref Range Status   MRSA by PCR NEGATIVE NEGATIVE Final    Comment:        The GeneXpert MRSA Assay (FDA approved for NASAL specimens only), is one component of a comprehensive MRSA colonization surveillance program. It is not intended to diagnose MRSA infection nor to guide or monitor treatment for MRSA infections.     Radiology Reports Dg Chest 2 View  04/21/2015  CLINICAL DATA:  Lower extremity swelling and edema for 2 months. EXAM: CHEST  2 VIEW COMPARISON:  None. FINDINGS: Low lung volumes. The lungs are clear wiithout focal pneumonia, edema, pneumothorax or pleural effusion. Interstitial markings are diffusely coarsened with chronic features. There is pulmonary vascular congestion without overt pulmonary edema. The cardio pericardial silhouette is enlarged. Patient is status post median sternotomy Bones are diffusely demineralized. IMPRESSION: Enlargement of the cardiopericardial silhouette. Cardiomegaly versus pericardial effusion. Vascular congestion without overt edema. Electronically Signed   By: Kennith CenterEric  Mansell M.D.   On: 04/21/2015 19:28    CBC  Recent Labs Lab 04/21/15 1938 04/23/15 0451  WBC 7.7 7.1  HGB 12.9 12.6  HCT 40.9 40.2  PLT 172 150  MCV 89.5 89.9  MCH 28.2 28.2  MCHC 31.5 31.3  RDW 14.8 14.9  LYMPHSABS 1.5  --   MONOABS 0.5  --   EOSABS 0.2  --   BASOSABS 0.0  --     Chemistries   Recent Labs Lab 04/21/15 1938 04/22/15 0323  04/23/15 0451 04/23/15 1143 04/24/15 0319  NA 137 138 137  --  139  K 4.4 4.6 3.9  --  3.8  CL 105 107 107  --  105  CO2 22 23 23   --  22  GLUCOSE 247* 259* 76  --  153*  BUN 49* 49* 49*  --  50*  CREATININE 1.43* 1.53* 1.61*  --  1.59*  CALCIUM 9.2 9.2 9.1  --  9.1  MG  --   --   --  1.8  --    ------------------------------------------------------------------------------------------------------------------ estimated creatinine  clearance is 28.8 mL/min (by C-G formula based on Cr of 1.59). ------------------------------------------------------------------------------------------------------------------ No results for input(s): HGBA1C in the last 72 hours. ------------------------------------------------------------------------------------------------------------------ No results for input(s): CHOL, HDL, LDLCALC, TRIG, CHOLHDL, LDLDIRECT in the last 72 hours. ------------------------------------------------------------------------------------------------------------------ No results for input(s): TSH, T4TOTAL, T3FREE, THYROIDAB in the last 72 hours.  Invalid input(s): FREET3 ------------------------------------------------------------------------------------------------------------------ No results for input(s): VITAMINB12, FOLATE, FERRITIN, TIBC, IRON, RETICCTPCT in the last 72 hours.  Coagulation profile No results for input(s): INR, PROTIME in the last 168 hours.  No results for input(s): DDIMER in the last 72 hours.  Cardiac Enzymes  Recent Labs Lab 04/21/15 2334 04/22/15 0323 04/22/15 0935  TROPONINI 0.16* 0.15* 0.13*   ------------------------------------------------------------------------------------------------------------------ Invalid input(s): POCBNP    Angela Stuart D.O. on 04/24/2015 at 10:12 AM  Between 7am to 7pm - Pager - [phone]  After 7pm go to [email or web address] - password [email or web address]  And look for the night coverage person covering for me after  hours  [facility] Office  [phone]

## 2015-04-24 NOTE — Consult Note (Signed)
Referring Physician:  [name] is an 82 y.o. female.                       Chief Complaint: Leg edema and shortness of breath  HPI: 82 y.o. female with h/o CHF, CABG, DM, Hypertension has several weeks history of worsening peripheral edema and generalized weakness and fatigue.She recently moved to [place] area from [place]. No chest pain, fever or cough. Chest x-ray showed cardiomegaly and vascular congestion. BNP was markedly elevated. Small diuresis with IV lasix dose.  Past Medical History  Diagnosis Date  . CHF (congestive heart failure) ([place])   . Hypertension   . Coronary artery disease   . Hyperlipidemia   . Myocardial infarction ([place]) 1970s X1  . Type II diabetes mellitus ([place])   . Migraine     "stopped when I had hysterectomy"  . Fibromyalgia     "feet and arms" (04/22/2015)  . Anxiety   . Depression   . Fluid retention   . Low blood pressure   . Anemia       Past Surgical History  Procedure Laterality Date  . Cholecystectomy open  1988  . Vaginal hysterectomy  1977  . Cesarean section  X 1  . Cataract extraction w/ intraocular lens  implant, bilateral Bilateral ~ 2011  . Cardiac catheterization    . Coronary artery bypass graft  2006    "CABG X2; [facility]; [place], [place]"  . Coronary angioplasty      Family History  Problem Relation Age of Onset  . Heart failure Father 91    Deceased  . Stroke Mother 52    Deceased  . Skin cancer Sister 60    Living  . Alzheimer's disease Sister 9    Living  . Skin cancer Brother 81    Living  . Diabetes Mellitus II Son 46    Living  . Heart Problems Daughter 52    Living  . Cancer    . Arthritis Mother    Social History:  reports that she has never smoked. She has never used smokeless tobacco. She reports that she does not drink alcohol or use illicit drugs.  Allergies:  Allergies  Allergen Reactions  . Codeine Nausea And Vomiting and Other (See Comments)    headaches  . Lipitor [Atorvastatin]  Nausea And Vomiting and Other (See Comments)    headaches    Medications Prior to Admission  Medication Sig Dispense Refill  . aspirin EC 81 MG tablet Take 81 mg by mouth daily.    . bumetanide (BUMEX) 1 MG tablet Take 1 mg by mouth once a week.     . carvedilol (COREG CR) 10 MG 24 hr capsule Take 10 mg by mouth daily.    . citalopram (CELEXA) 10 MG tablet Take 10 mg by mouth daily.    . ferrous sulfate 325 (65 FE) MG EC tablet Take 325 mg by mouth 2 (two) times daily.    . metFORMIN (GLUCOPHAGE) 500 MG tablet Take 500 mg by mouth 2 (two) times daily with a meal.    . rosuvastatin (CRESTOR) 10 MG tablet Take 5 mg by mouth every evening.     Marland Kitchen acetaminophen (TYLENOL) 325 MG tablet Take 650 mg by mouth every 4 (four) hours as needed.      Results for orders placed or performed during the hospital encounter of 04/21/15 (from the past 48 hour(s))  Glucose, capillary     Status: Abnormal  Collection Time: 04/22/15 11:13 AM  Result Value Ref Range   Glucose-Capillary 222 (H) 65 - 99 mg/dL   Comment 1 Notify RN   Glucose, capillary     Status: Abnormal   Collection Time: 04/22/15  4:17 PM  Result Value Ref Range   Glucose-Capillary 184 (H) 65 - 99 mg/dL   Comment 1 Notify RN   Basic metabolic panel     Status: Abnormal   Collection Time: 04/23/15  4:51 AM  Result Value Ref Range   Sodium 137 135 - 145 mmol/L   Potassium 3.9 3.5 - 5.1 mmol/L   Chloride 107 101 - 111 mmol/L   CO2 23 22 - 32 mmol/L   Glucose, Bld 76 65 - 99 mg/dL   BUN 49 (H) 6 - 20 mg/dL   Creatinine, Ser 1.61 (H) 0.44 - 1.00 mg/dL   Calcium 9.1 8.9 - 10.3 mg/dL   GFR calc non Af Amer 29 (L) >60 mL/min   GFR calc Af Amer 33 (L) >60 mL/min    Comment: (NOTE) The eGFR has been calculated using the CKD EPI equation. This calculation has not been validated in all clinical situations. eGFR's persistently <60 mL/min signify possible Chronic Kidney Disease.    Anion gap 7 5 - 15  CBC     Status: None   Collection  Time: 04/23/15  4:51 AM  Result Value Ref Range   WBC 7.1 4.0 - 10.5 K/uL   RBC 4.47 3.87 - 5.11 MIL/uL   Hemoglobin 12.6 12.0 - 15.0 g/dL   HCT 40.2 36.0 - 46.0 %   MCV 89.9 78.0 - 100.0 fL   MCH 28.2 26.0 - 34.0 pg   MCHC 31.3 30.0 - 36.0 g/dL   RDW 14.9 11.5 - 15.5 %   Platelets 150 150 - 400 K/uL  Glucose, capillary     Status: None   Collection Time: 04/23/15  6:23 AM  Result Value Ref Range   Glucose-Capillary 86 65 - 99 mg/dL  Magnesium     Status: None   Collection Time: 04/23/15 11:43 AM  Result Value Ref Range   Magnesium 1.8 1.7 - 2.4 mg/dL  Glucose, capillary     Status: Abnormal   Collection Time: 04/23/15 11:52 AM  Result Value Ref Range   Glucose-Capillary 107 (H) 65 - 99 mg/dL   Comment 1 Notify RN    Comment 2 Document in Chart   Glucose, capillary     Status: Abnormal   Collection Time: 04/23/15  4:48 PM  Result Value Ref Range   Glucose-Capillary 149 (H) 65 - 99 mg/dL   Comment 1 Notify RN   Glucose, capillary     Status: Abnormal   Collection Time: 04/23/15  9:31 PM  Result Value Ref Range   Glucose-Capillary 187 (H) 65 - 99 mg/dL  Basic metabolic panel     Status: Abnormal   Collection Time: 04/24/15  3:19 AM  Result Value Ref Range   Sodium 139 135 - 145 mmol/L   Potassium 3.8 3.5 - 5.1 mmol/L   Chloride 105 101 - 111 mmol/L   CO2 22 22 - 32 mmol/L   Glucose, Bld 153 (H) 65 - 99 mg/dL   BUN 50 (H) 6 - 20 mg/dL   Creatinine, Ser 1.59 (H) 0.44 - 1.00 mg/dL   Calcium 9.1 8.9 - 10.3 mg/dL   GFR calc non Af Amer 29 (L) >60 mL/min   GFR calc Af Amer 34 (L) >60 mL/min  Comment: (NOTE) The eGFR has been calculated using the CKD EPI equation. This calculation has not been validated in all clinical situations. eGFR's persistently <60 mL/min signify possible Chronic Kidney Disease.    Anion gap 12 5 - 15  Glucose, capillary     Status: Abnormal   Collection Time: 04/24/15  5:51 AM  Result Value Ref Range   Glucose-Capillary 134 (H) 65 - 99 mg/dL    No results found.  Review Of Systems Constitutional: Negative for fever, chills and diaphoresis.  Respiratory: Negative for cough and positive for shortness of breath.  Cardiovascular: Positive for chest pain and leg swelling. Negative for palpitations and syncope.  Gastrointestinal: Negative for heartburn, nausea, vomiting and abdominal pain.  Musculoskeletal: Positive for arthritis. Negative for back pain. Endocrinology: No polyuria or polydipsia. Positive for some cold intolerance.  Neurological: Negative for dizziness and positive for weakness.   Blood pressure 115/86, pulse 96, temperature 97.7 F (36.5 C), temperature source Oral, resp. rate 18, height _0  (1.676 m), weight 75.524 kg (166 lb 8 oz), SpO2 99 %. General: Well developed, well nourished, NAD, appears stated age HEENT: NCAT, Conj-pink, Sclera-non-icteric, mucous membranes moist.  Neck: + JVD. No bruit. Cardiovascular: S1, S2 auscultated, RRR, II/VI systolic murmur. Respiratory: Few basal crackles. No wheezing. Abdomen: Soft, nontender, nondistended, + bowel sounds Extremities:  2 + edema up to thighs + presacral edema. Neuro: AxOx3, moves all 4 extremities. Skin: 6 inch erythematous irregular border patch with tenderness over right lower leg anterior aspect. Psych: Normal affect and demeanor with intact judgement and insight  Assessment/Plan Acute on chronic left heart systolic failure Acute kidney injury vs CKD Abnormal troponin-I from renal insufficiency or demand ischemia DM, II Dyslipidemia Depression R/O hypothyroidism R/O hypoalbuminemia  Continue diuresis Check TSH and LFT. Additional cardiac work-up soon.   Birdie Riddle, MD  04/24/2015, 10:14 AM

## 2015-04-24 NOTE — Evaluation (Signed)
Physical Therapy Evaluation Patient Details Name: Angela Stuart MRN: [id number] DOB: 10-03-1931 Today's Date: 04/24/2015   History of Present Illness  82 yo female with onset of CHF and renal insufficiency is having elevated troponin from demand ischemia, LE edema with RLE redness, pericardial effusion.  PMHx:  CABG, CHF  Clinical Impression  Pt was able to get up to walk with PT and discussed her needs for further follow up therapy.  Pt is going to try for home and if unable to manage stairs may need to reconsider the home destination for discharge    Follow Up Recommendations Home health PT;Supervision/Assistance - 24 hour    Equipment Recommendations  Rolling walker with 5" wheels    Recommendations for Other Services       Precautions / Restrictions Precautions Precautions: Fall (telemetry) Restrictions Weight Bearing Restrictions: No      Mobility  Bed Mobility Overal bed mobility:  (up when PT entered)                Transfers Overall transfer level: Needs assistance Equipment used: 1 person hand held assist;Rolling walker (2 wheeled) Transfers: Sit to/from [employer] Sit to Stand: Min guard;Min assist Stand pivot transfers: Min guard;Min assist       General transfer comment: reminded about correct hand placement  Ambulation/Gait Ambulation/Gait assistance: Min guard Ambulation Distance (Feet): 150 Feet Assistive device: Rolling walker (2 wheeled);1 person hand held assist Gait Pattern/deviations: Step-through pattern;Decreased stride length;Trunk flexed;Wide base of support (reminders for judgment of distances that are reasonable) Gait velocity: slower Gait velocity interpretation: Below normal speed for age/gender    Stairs            Wheelchair Mobility    Modified Rankin (Stroke Patients Only)       Balance Overall balance assessment: Needs assistance Sitting-balance support: Feet supported Sitting balance-Leahy  Scale: Good   Postural control: Posterior lean Standing balance support: Bilateral upper extremity supported Standing balance-Leahy Scale: Fair Standing balance comment: poor dynamic balance                             Pertinent Vitals/Pain Pain Assessment: No/denies pain    Home Living Family/patient expects to be discharged to:: Private residence Living Arrangements: Children;Other relatives Available Help at Discharge: Family;Available PRN/intermittently Type of Home: House Home Access: Stairs to enter Entrance Stairs-Rails: None Entrance Stairs-Number of Steps: 2 Home Layout: Two level Home Equipment: Walker - 4 wheels      Prior Function Level of Independence: Independent with assistive device(s)         Comments: lived in ALF previous to living with son     Hand Dominance        Extremity/Trunk Assessment   Upper Extremity Assessment: Overall WFL for tasks assessed           Lower Extremity Assessment: Overall WFL for tasks assessed (edema and skin changes are present in LE's)      Cervical / Trunk Assessment: Normal  Communication   Communication: No difficulties  Cognition Arousal/Alertness: Awake/alert Behavior During Therapy: WFL for tasks assessed/performed Overall Cognitive Status: Within Functional Limits for tasks assessed                      General Comments General comments (skin integrity, edema, etc.): Pt is getting up to chair with nursing and has limited gait endurance.  Pt is appropriate for HHPT to follow up as pt  presents an increased fall risk.  Should hopefully be able to transition home but needs to climb stairs to complete rehab    Exercises        Assessment/Plan    PT Assessment Patient needs continued PT services  PT Diagnosis Difficulty walking   PT Problem List Decreased range of motion;Decreased activity tolerance;Decreased balance;Decreased mobility;Decreased coordination;Cardiopulmonary  status limiting activity;Decreased skin integrity  PT Treatment Interventions DME instruction;Gait training;Stair training;Functional mobility training;Therapeutic activities;Therapeutic exercise;Balance training;Neuromuscular re-education;Cognitive remediation;Patient/family education   PT Goals (Current goals can be found in the Care Plan section) Acute Rehab PT Goals Patient Stated Goal: to walk safely and get home PT Goal Formulation: With patient Time For Goal Achievement: 05/08/15 Potential to Achieve Goals: Good    Frequency Min 2X/week   Barriers to discharge Inaccessible home environment;Decreased caregiver support son works and house has many sets of steps    Co-evaluation               End of Session Equipment Utilized During Treatment: Gait belt Activity Tolerance: Patient tolerated treatment well Patient left: in chair;with call bell/phone within reach Nurse Communication: Mobility status         Time: 1211-1228 PT Time Calculation (min) (ACUTE ONLY): 17 min   Charges:   PT Evaluation $PT Eval Moderate Complexity: 1 Procedure     PT G CodesIvar Drape:        [name] 04/24/2015, 1:30 PM   [name], PT MS Acute Rehab Dept. Number: [facility] [phone] and [facility] [phone]

## 2015-04-25 DIAGNOSIS — R600 Localized edema: Secondary | ICD-10-CM

## 2015-04-25 LAB — CBC
HEMATOCRIT: 39.6 % (ref 36.0–46.0)
Hemoglobin: 12.6 g/dL (ref 12.0–15.0)
MCH: 28.7 pg (ref 26.0–34.0)
MCHC: 31.8 g/dL (ref 30.0–36.0)
MCV: 90.2 fL (ref 78.0–100.0)
PLATELETS: 151 10*3/uL (ref 150–400)
RBC: 4.39 MIL/uL (ref 3.87–5.11)
RDW: 15.1 % (ref 11.5–15.5)
WBC: 7.4 10*3/uL (ref 4.0–10.5)

## 2015-04-25 LAB — GLUCOSE, CAPILLARY
Glucose-Capillary: 164 mg/dL — ABNORMAL HIGH (ref 65–99)
Glucose-Capillary: 230 mg/dL — ABNORMAL HIGH (ref 65–99)
Glucose-Capillary: 238 mg/dL — ABNORMAL HIGH (ref 65–99)
Glucose-Capillary: 154 mg/dL — ABNORMAL HIGH (ref 65–99)

## 2015-04-25 LAB — BASIC METABOLIC PANEL
Anion gap: 9 (ref 5–15)
BUN: 47 mg/dL — AB (ref 6–20)
CO2: 22 mmol/L (ref 22–32)
Calcium: 8.9 mg/dL (ref 8.9–10.3)
Chloride: 105 mmol/L (ref 101–111)
Creatinine, Ser: 1.58 mg/dL — ABNORMAL HIGH (ref 0.44–1.00)
GFR calc Af Amer: 34 mL/min — ABNORMAL LOW (ref >60)
GFR, EST NON AFRICAN AMERICAN: 30 mL/min — AB (ref >60)
GLUCOSE: 188 mg/dL — AB (ref 65–99)
POTASSIUM: 3.6 mmol/L (ref 3.5–5.1)
Sodium: 136 mmol/L (ref 135–145)

## 2015-04-25 LAB — T4, FREE: Free T4: 1.59 ng/dL — ABNORMAL HIGH (ref 0.61–1.12)

## 2015-04-25 MED ORDER — ENOXAPARIN SODIUM 30 MG/0.3ML ~~LOC~~ SOLN
30.0000 mg | SUBCUTANEOUS | Status: DC
  Administered 2015-04-25 – 2015-04-26 (×2): 30 mg via SUBCUTANEOUS
  Filled 2015-04-25 (×2): qty 0.3

## 2015-04-25 MED ORDER — INSULIN ASPART 100 UNIT/ML ~~LOC~~ SOLN
0.0000 [IU] | Freq: Three times a day (TID) | SUBCUTANEOUS | Status: DC
  Administered 2015-04-26: 3 [IU] via SUBCUTANEOUS
  Administered 2015-04-26: 2 [IU] via SUBCUTANEOUS
  Administered 2015-04-27 – 2015-04-30 (×4): 3 [IU] via SUBCUTANEOUS
  Administered 2015-04-30: 2 [IU] via SUBCUTANEOUS
  Administered 2015-05-01: 8 [IU] via SUBCUTANEOUS
  Administered 2015-05-01 (×2): 2 [IU] via SUBCUTANEOUS
  Administered 2015-05-02: 3 [IU] via SUBCUTANEOUS
  Administered 2015-05-02 (×2): 8 [IU] via SUBCUTANEOUS
  Administered 2015-05-03: 3 [IU] via SUBCUTANEOUS
  Administered 2015-05-03: 2 [IU] via SUBCUTANEOUS
  Administered 2015-05-03: 5 [IU] via SUBCUTANEOUS
  Administered 2015-05-04: 3 [IU] via SUBCUTANEOUS
  Administered 2015-05-04 – 2015-05-05 (×2): 5 [IU] via SUBCUTANEOUS
  Administered 2015-05-05 (×2): 3 [IU] via SUBCUTANEOUS
  Administered 2015-05-06: 2 [IU] via SUBCUTANEOUS

## 2015-04-25 NOTE — Consult Note (Signed)
Ref: GREEN, Lenon Curt, MD   Subjective:  Improving breathing.   Objective:  Vital Signs in the last 24 hours: Temp:  [97.4 F (36.3 C)-97.8 F (36.6 C)] 97.8 F (36.6 C) (01/15 2100) Pulse Rate:  [69-89] 88 (01/15 2100) Cardiac Rhythm:  [-] Normal sinus rhythm;Bundle branch block (01/15 1900) Resp:  [20] 20 (01/15 2100) BP: (105-139)/(57-80) 123/57 mmHg (01/15 2100) SpO2:  [98 %-100 %] 98 % (01/15 2100) Weight:  [75.388 kg (166 lb 3.2 oz)] 75.388 kg (166 lb 3.2 oz) (01/15 0426)  Physical Exam: BP Readings from Last 1 Encounters:  04/25/15 123/57    Wt Readings from Last 1 Encounters:  04/25/15 75.388 kg (166 lb 3.2 oz)    Weight change:   HEENT: [place]/AT, Eyes-Brown, PERL, EOMI, Conjunctiva-Pink, Sclera-Non-icteric Neck: + JVD, No bruit, Trachea midline. Lungs:  Clear, Bilateral. Cardiac:  Regular rhythm, normal S1 and S2, no S3. II/VI systolic murmur. Abdomen:  Soft, non-tender. Extremities:  2 + edema present. No cyanosis. No clubbing. CNS: AxOx3, Cranial nerves grossly intact, moves all 4 extremities. Right handed. Skin: Warm and dry. Right leg erythema as before.   Intake/Output from previous day: 01/14 0701 - 01/15 0700 In: 873 [P.O.:820; I.V.:3; IV Piggyback:50] Out: 920 [Urine:920]    Lab Results: BMET    Component Value Date/Time   NA 136 04/25/2015 0230   NA 139 04/24/2015 0319   NA 137 04/23/2015 0451   K 3.6 04/25/2015 0230   K 3.8 04/24/2015 0319   K 3.9 04/23/2015 0451   CL 105 04/25/2015 0230   CL 105 04/24/2015 0319   CL 107 04/23/2015 0451   CO2 22 04/25/2015 0230   CO2 22 04/24/2015 0319   CO2 23 04/23/2015 0451   GLUCOSE 188* 04/25/2015 0230   GLUCOSE 153* 04/24/2015 0319   GLUCOSE 76 04/23/2015 0451   BUN 47* 04/25/2015 0230   BUN 50* 04/24/2015 0319   BUN 49* 04/23/2015 0451   CREATININE 1.58* 04/25/2015 0230   CREATININE 1.59* 04/24/2015 0319   CREATININE 1.61* 04/23/2015 0451   CALCIUM 8.9 04/25/2015 0230   CALCIUM 9.1 04/24/2015  0319   CALCIUM 9.1 04/23/2015 0451   GFRNONAA 30* 04/25/2015 0230   GFRNONAA 29* 04/24/2015 0319   GFRNONAA 29* 04/23/2015 0451   GFRAA 34* 04/25/2015 0230   GFRAA 34* 04/24/2015 0319   GFRAA 33* 04/23/2015 0451   CBC    Component Value Date/Time   WBC 7.4 04/25/2015 0230   RBC 4.39 04/25/2015 0230   HGB 12.6 04/25/2015 0230   HCT 39.6 04/25/2015 0230   PLT 151 04/25/2015 0230   MCV 90.2 04/25/2015 0230   MCH 28.7 04/25/2015 0230   MCHC 31.8 04/25/2015 0230   RDW 15.1 04/25/2015 0230   LYMPHSABS 1.5 04/21/2015 1938   MONOABS 0.5 04/21/2015 1938   EOSABS 0.2 04/21/2015 1938   BASOSABS 0.0 04/21/2015 1938   HEPATIC Function Panel  Recent Labs  04/24/15 1151  PROT 6.3*   HEMOGLOBIN A1C No components found for: HGA1C,  MPG CARDIAC ENZYMES Lab Results  Component Value Date   TROPONINI 0.13* 04/22/2015   TROPONINI 0.15* 04/22/2015   TROPONINI 0.16* 04/21/2015   BNP No results for input(s): PROBNP in the last 8760 hours. TSH  Recent Labs  04/24/15 1151  TSH 5.028*   CHOLESTEROL No results for input(s): CHOL in the last 8760 hours.  Scheduled Meds: . aspirin EC  81 mg Oral Daily  . citalopram  10 mg Oral Daily  . doxycycline  100 mg Oral Q12H  . enoxaparin (LOVENOX) injection  30 mg Subcutaneous Q24H  . ferrous sulfate  325 mg Oral BID  . furosemide  80 mg Intravenous BID  . [START ON 04/26/2015] insulin aspart  0-15 Units Subcutaneous TID WC  . insulin glargine  12 Units Subcutaneous q morning - 10a  . rosuvastatin  5 mg Oral QPM  . sodium chloride  3 mL Intravenous Q12H   Continuous Infusions:  PRN Meds:.sodium chloride, acetaminophen, ondansetron (ZOFRAN) IV, polyvinyl alcohol, sodium chloride  Assessment/Plan: Acute on chronic left heart systolic failure Acute kidney injury vs CKD Abnormal troponin-I from renal insufficiency or demand ischemia DM, II Dyslipidemia Depression Hypothyroidism unlikely with elevated free T 4 and borderline TSH  elevation in sick patient. Hypoalbuminemia   Continue diuresis as tolerated.   LOS: 4 days    Orpah CobbAjay Loretta Doutt  MD  04/25/2015, 10:23 PM

## 2015-04-25 NOTE — Progress Notes (Signed)
[facility]                                                                              Patient Demographics  Angela PitmanMary Stuart, is a 82 y.o. female, DOB - 1931/06/26, ZOX:[id number]  Admit date - 04/21/2015   Admitting Physician Angela BowJared M Gardner, DO  Outpatient Primary MD for the patient is Stuart, Lenon CurtARTHUR G, MD  LOS - 4   Chief Complaint  Patient presents with  . Leg Pain      HPI on 04/21/2015 by Dr. Lyda PeroneJared Stuart Angela PitmanMary Stuart is a 82 y.o. female with h/o CHF, CABG, DM, HTN. Patient presents to the ED with several week history of worsening peripheral edema and generalized weakness and fatigue. This became severe enough today that she could not walk per family. [place] Symptoms are not being helped by her home Lasix which she takes once a day at noon (dose unknown). She denies any chest pain, no change in urination.  Assessment & Plan   Acute Combined systolic/diastolic CHF exacerbation -CXR: Enlargement of cardiopericardial silhouette, cardiomegaly versus pericardial effusion. Vascular congestion without overt edema -Echocardiogram: EF 35-40%, diastolic dysfunction, Severe MR posteriorly  -BNP >4500 -Continue lasix, increased by cardiology today to 80mg  IV BID -Monitor daily weights, intake/output -Cardiology consulted and appreciated -No ACEI given AKI vs CKD -UOP 920cc over past 24 hours  Venous insuff. Lower extremity vs cellulitis -RLE appears to have erythema, patient currently afebrile, no leukocytosis. States it has been red for quite some time -Unlikely cellulitis, continue to monitor- erythema not improving,  Continue doxycycline -LE Doppler: negative for DVT  Elevated troponin -Likely secondary to demand ischemia vs CHF exac -Remaining fairly flat.  Acute kidney injury vs CKD -No previous Creatinine in the system -metformin held -Pending records -Cr 1.58 today, likely due to diuresis vs CKD -Continue to monitor  BMP -Renal US: normal renal sizes and parenchymal echogenicity, no hydronephrosis.  Ascites, liver appearance raises ?cirrhosis   Diabetes mellitus, type 2 -Continue home lantus, ISS, and CBG monitoring -Metformin held -Hemoglobin A1c pending   Depression  -Continue Celexa  Hyperlipidemia -Continue statin  Constipation -Patient was able to have BM   ?Cirrhosis  -Found on renal US -LFTs nornmal  Abnormal TSH -TSH 5.028, FT4 pending  Code Status: Full  Family Communication: None at bedside.    Disposition Plan: Admitted. Continue to diurese   Time Spent in minutes   30 minutes  Procedures  Echocardiogram  Consults   Cardiology  DVT Prophylaxis  heparin  Lab Results  Component Value Date   PLT 151 04/25/2015    Medications  Scheduled Meds: . aspirin EC  81 mg Oral Daily  . citalopram  10 mg Oral Daily  . doxycycline  100 mg Oral Q12H  . ferrous sulfate  325 mg Oral BID  . furosemide  80 mg Intravenous BID  . heparin  5,000 Units Subcutaneous 3 times per day  . insulin glargine  12 Units Subcutaneous q morning - 10a  . rosuvastatin  5 mg Oral QPM  . sodium chloride  3 mL Intravenous Q12H   Continuous Infusions:  PRN Meds:.sodium chloride, acetaminophen, ondansetron (ZOFRAN)  IV, polyvinyl alcohol, sodium chloride  Antibiotics    Anti-infectives    Start     Dose/Rate Route Frequency Ordered Stop   04/24/15 1045  doxycycline (VIBRA-TABS) tablet 100 mg     100 mg Oral Every 12 hours 04/24/15 1031        Subjective:   Angela Stuart seen and examined today.  Patient states she is feeling better today.  Still complains of LE swelling.  Was able to have a BM yesterday.  Denies chest pain, abdominal pain, nausea, vomiting, diarrhea.    Objective:   Filed Vitals:   04/24/15 1959 04/24/15 2344 04/25/15 0046 04/25/15 0426  BP: 124/65 114/67 139/80 126/65  Pulse: 85 82 89 87  Temp: 97.8 F (36.6 C) 97.4 F (36.3 C) 97.5 F (36.4 C) 97.6 F (36.4 C)   TempSrc: Oral Oral Oral Oral  Resp: 20 20 20 20   Height:      Weight:    75.388 kg (166 lb 3.2 oz)  SpO2: 100% 98% 100% 98%    Wt Readings from Last 3 Encounters:  04/25/15 75.388 kg (166 lb 3.2 oz)     Intake/Output Summary (Last 24 hours) at 04/25/15 1051 Last data filed at 04/25/15 0925  Gross per 24 hour  Intake    873 ml  Output    845 ml  Net     28 ml    Exam  General: Well developed, well nourished, NAD  HEENT: NCAT, mucous membranes moist.   Cardiovascular: S1 S2 auscultated, RRR, soft SEM  Respiratory: Clear to auscultation bilaterally  Abdomen: Soft, nontender, nondistended, + bowel sounds  Extremities: warm dry without cyanosis clubbing. 2+ edema in LE B/L. Erythema RLE  Neuro: AAOx3, nonfocal  Psych: Normal affect and demeanor, pleasant   Data Review   Micro Results Recent Results (from the past 240 hour(s))  MRSA PCR Screening     Status: None   Collection Time: 04/21/15 10:55 PM  Result Value Ref Range Status   MRSA by PCR NEGATIVE NEGATIVE Final    Comment:        The GeneXpert MRSA Assay (FDA approved for NASAL specimens only), is one component of a comprehensive MRSA colonization surveillance program. It is not intended to diagnose MRSA infection nor to guide or monitor treatment for MRSA infections.     Radiology Reports Dg Chest 2 View  04/21/2015  CLINICAL DATA:  Lower extremity swelling and edema for 2 months. EXAM: CHEST  2 VIEW COMPARISON:  None. FINDINGS: Low lung volumes. The lungs are clear wiithout focal pneumonia, edema, pneumothorax or pleural effusion. Interstitial markings are diffusely coarsened with chronic features. There is pulmonary vascular congestion without overt pulmonary edema. The cardio pericardial silhouette is enlarged. Patient is status post median sternotomy Bones are diffusely demineralized. IMPRESSION: Enlargement of the cardiopericardial silhouette. Cardiomegaly versus pericardial effusion. Vascular  congestion without overt edema. Electronically Signed   By: Kennith Center M.D.   On: 04/21/2015 19:28   US Renal  04/24/2015  CLINICAL DATA:  Chronic kidney disease. EXAM: RENAL / URINARY TRACT ULTRASOUND COMPLETE COMPARISON:  None. FINDINGS: Right Kidney: Length: 10.0 cm. Normal parenchymal echogenicity. 2.7 cm cortical cyst arises from the upper pole. No other renal masses, no stones and no hydronephrosis. Left Kidney: Length: 10.2 cm. Echogenicity within normal limits. No mass or hydronephrosis visualized. Bladder: Appears normal for degree of bladder distention. Note is made of small to moderate amount of ascites. Liver appears nodular with a coarsened echotexture suggesting cirrhosis.  IMPRESSION: 1. Normal renal sizes and parenchymal echogenicity. No hydronephrosis. Right renal cyst. No other masses. 2. Ascites.  Liver appearance raises suspicion for cirrhosis. Electronically Signed   By: Amie Portland M.D.   On: 04/24/2015 11:56    CBC  Recent Labs Lab 04/21/15 1938 04/23/15 0451 04/25/15 0230  WBC 7.7 7.1 7.4  HGB 12.9 12.6 12.6  HCT 40.9 40.2 39.6  PLT 172 150 151  MCV 89.5 89.9 90.2  MCH 28.2 28.2 28.7  MCHC 31.5 31.3 31.8  RDW 14.8 14.9 15.1  LYMPHSABS 1.5  --   --   MONOABS 0.5  --   --   EOSABS 0.2  --   --   BASOSABS 0.0  --   --     Chemistries   Recent Labs Lab 04/21/15 1938 04/22/15 0323 04/23/15 0451 04/23/15 1143 04/24/15 0319 04/24/15 1151 04/25/15 0230  NA 137 138 137  --  139  --  136  K 4.4 4.6 3.9  --  3.8  --  3.6  CL 105 107 107  --  105  --  105  CO2 22 23 23   --  22  --  22  GLUCOSE 247* 259* 76  --  153*  --  188*  BUN 49* 49* 49*  --  50*  --  47*  CREATININE 1.43* 1.53* 1.61*  --  1.59*  --  1.58*  CALCIUM 9.2 9.2 9.1  --  9.1  --  8.9  MG  --   --   --  1.8  --   --   --   AST  --   --   --   --   --  27  --   ALT  --   --   --   --   --  13*  --   ALKPHOS  --   --   --   --   --  70  --   BILITOT  --   --   --   --   --  1.2  --     ------------------------------------------------------------------------------------------------------------------ estimated creatinine clearance is 29 mL/min (by C-G formula based on Cr of 1.58). ------------------------------------------------------------------------------------------------------------------ No results for input(s): HGBA1C in the last 72 hours. ------------------------------------------------------------------------------------------------------------------ No results for input(s): CHOL, HDL, LDLCALC, TRIG, CHOLHDL, LDLDIRECT in the last 72 hours. ------------------------------------------------------------------------------------------------------------------  Recent Labs  04/24/15 1151  TSH 5.028*   ------------------------------------------------------------------------------------------------------------------ No results for input(s): VITAMINB12, FOLATE, FERRITIN, TIBC, IRON, RETICCTPCT in the last 72 hours.  Coagulation profile No results for input(s): INR, PROTIME in the last 168 hours.  No results for input(s): DDIMER in the last 72 hours.  Cardiac Enzymes  Recent Labs Lab 04/21/15 2334 04/22/15 0323 04/22/15 0935  TROPONINI 0.16* 0.15* 0.13*   ------------------------------------------------------------------------------------------------------------------ Invalid input(s): POCBNP    Hyman Crossan D.O. on 04/25/2015 at 10:51 AM  Between 7am to 7pm - Pager - [phone]  After 7pm go to [email or web address] - password [email or web address]  And look for the night coverage person covering for me after hours  [facility] Office  [phone]

## 2015-04-26 DIAGNOSIS — R7989 Other specified abnormal findings of blood chemistry: Secondary | ICD-10-CM

## 2015-04-26 DIAGNOSIS — E876 Hypokalemia: Secondary | ICD-10-CM

## 2015-04-26 LAB — CBC
HCT: 38.4 % (ref 36.0–46.0)
Hemoglobin: 12 g/dL (ref 12.0–15.0)
MCH: 27.7 pg (ref 26.0–34.0)
MCHC: 31.3 g/dL (ref 30.0–36.0)
MCV: 88.7 fL (ref 78.0–100.0)
PLATELETS: 146 10*3/uL — AB (ref 150–400)
RBC: 4.33 MIL/uL (ref 3.87–5.11)
RDW: 14.9 % (ref 11.5–15.5)
WBC: 7.1 10*3/uL (ref 4.0–10.5)

## 2015-04-26 LAB — HEMOGLOBIN A1C
HEMOGLOBIN A1C: 8.2 % — AB (ref 4.8–5.6)
MEAN PLASMA GLUCOSE: 189 mg/dL

## 2015-04-26 LAB — BASIC METABOLIC PANEL
ANION GAP: 7 (ref 5–15)
BUN: 45 mg/dL — ABNORMAL HIGH (ref 6–20)
CALCIUM: 8.8 mg/dL — AB (ref 8.9–10.3)
CO2: 25 mmol/L (ref 22–32)
Chloride: 106 mmol/L (ref 101–111)
Creatinine, Ser: 1.5 mg/dL — ABNORMAL HIGH (ref 0.44–1.00)
GFR, EST AFRICAN AMERICAN: 36 mL/min — AB (ref >60)
GFR, EST NON AFRICAN AMERICAN: 31 mL/min — AB (ref >60)
GLUCOSE: 150 mg/dL — AB (ref 65–99)
Potassium: 3.3 mmol/L — ABNORMAL LOW (ref 3.5–5.1)
Sodium: 138 mmol/L (ref 135–145)

## 2015-04-26 LAB — GLUCOSE, CAPILLARY
GLUCOSE-CAPILLARY: 169 mg/dL — AB (ref 65–99)
Glucose-Capillary: 124 mg/dL — ABNORMAL HIGH (ref 65–99)
Glucose-Capillary: 104 mg/dL — ABNORMAL HIGH (ref 65–99)
Glucose-Capillary: 73 mg/dL (ref 65–99)

## 2015-04-26 MED ORDER — HYDROCORTISONE 2.5 % RE CREA
TOPICAL_CREAM | Freq: Three times a day (TID) | RECTAL | Status: DC
  Administered 2015-04-26 – 2015-04-27 (×3): via RECTAL
  Administered 2015-04-27: 1 via RECTAL
  Administered 2015-04-27 – 2015-04-28 (×2): via RECTAL
  Administered 2015-04-28: 1 via RECTAL
  Administered 2015-04-29 – 2015-05-02 (×10): via RECTAL
  Administered 2015-05-03: 1 via RECTAL
  Administered 2015-05-03 – 2015-05-04 (×5): via RECTAL
  Administered 2015-05-05: 1 via RECTAL
  Administered 2015-05-05 – 2015-05-06 (×3): via RECTAL
  Filled 2015-04-26 (×3): qty 28.35

## 2015-04-26 MED ORDER — POTASSIUM CHLORIDE CRYS ER 20 MEQ PO TBCR
40.0000 meq | EXTENDED_RELEASE_TABLET | Freq: Once | ORAL | Status: AC
  Administered 2015-04-26: 40 meq via ORAL
  Filled 2015-04-26: qty 2

## 2015-04-26 MED ORDER — BISACODYL 10 MG RE SUPP
10.0000 mg | Freq: Once | RECTAL | Status: AC
  Administered 2015-04-26: 10 mg via RECTAL
  Filled 2015-04-26: qty 1

## 2015-04-26 NOTE — Care Management Important Message (Signed)
Important Message  Patient Details  Name: Angela PitmanMary Stuart MRN: [id number] Date of Birth: [birth year]   Medicare Important Message Given:  Yes    Oralia RudMegan P Toribio Seiber 04/26/2015, 3:38 PM

## 2015-04-26 NOTE — Progress Notes (Signed)
Inpatient Diabetes Program Recommendations  AACE/ADA: New Consensus Statement on Inpatient Glycemic Control (2015)  Target Ranges:  Prepandial:   less than 140 mg/dL      Peak postprandial:   less than 180 mg/dL (1-2 hours)      Critically ill patients:  140 - 180 mg/dL   Review of Glycemic Control  Inpatient Diabetes Program Recommendations:  Insulin - Basal: . Insulin - Meal Coverage: add Novolog 4 units TID with meals per Glycemic Control Order-set  Diet: once eating again, add carb modified to heart healthy diet Thank you  Piedad ClimesGina Tess Potts BSN, RN,CDE Inpatient Diabetes Coordinator [phone] (team pager)

## 2015-04-26 NOTE — Progress Notes (Signed)
[facility]                                                                              Patient Demographics  Angela Stuart, is a 82 y.o. female, DOB - 07/30/1931, ZOX:[id number]  Admit date - 04/21/2015   Admitting Physician Hillary Bow, DO  Outpatient Primary MD for the patient is GREEN, Lenon Curt, MD  LOS - 5   Chief Complaint  Patient presents with  . Leg Pain      HPI on 04/21/2015 by Dr. Lyda Perone Angela Stuart is a 82 y.o. female with h/o CHF, CABG, DM, HTN. Patient presents to the ED with several week history of worsening peripheral edema and generalized weakness and fatigue. This became severe enough today that she could not walk per family. She recently moved to area from [place]. Symptoms are not being helped by her home Lasix which she takes once a day at noon (dose unknown). She denies any chest pain, no change in urination.  Assessment & Plan   Acute Combined systolic/diastolic CHF exacerbation -CXR: Enlargement of cardiopericardial silhouette, cardiomegaly versus pericardial effusion. Vascular congestion without overt edema -Echocardiogram: EF 35-40%, diastolic dysfunction, Severe MR posteriorly  -BNP >4500 -Continue lasix, increased by cardiology 80mg  IV BID -Monitor daily weights, intake/output -Cardiology consulted and appreciated -No ACEI given AKI vs CKD -UOP 1100cc over past 24 hours  Venous insuff. Lower extremity vs cellulitis -RLE appears to have erythema, patient currently afebrile, no leukocytosis. States it has been red for quite some time -Unlikely cellulitis, continue to monitor- erythema not improving,  Continue doxycycline -LE Doppler: negative for DVT  Elevated troponin -Likely secondary to demand ischemia vs CHF exac -Remaining fairly flat.  Acute kidney injury vs CKD -No previous Creatinine in the system -metformin held -Pending records -Cr 1.58 today, likely due to diuresis vs CKD -Continue to monitor BMP -Renal  US: normal renal sizes and parenchymal echogenicity, no hydronephrosis.  Ascites, liver appearance raises ?cirrhosis   Diabetes mellitus, type 2 -Continue home lantus, ISS, and CBG monitoring -Metformin held -Hemoglobin A1c 8.2 -Diabetes coordinator consulted  Hypokalemia -Likely secondary to diuresis -Replace and continue to monitor BMP  Depression  -Continue Celexa  Hyperlipidemia -Continue statin  Constipation -Patient was able to have BM   ?Cirrhosis  -Found on renal US -LFTs nornmal  Abnormal TSH -TSH 5.028, FT4 1.59 -Patient should have repeat thyroid test after acute illness has resolved  Code Status: Full  Family Communication: None at bedside.    Disposition Plan: Admitted. Continue to diurese as per cardiology. Suspect home within 24-48 hours  Time Spent in minutes   30 minutes  Procedures  Echocardiogram  Consults   Cardiology  DVT Prophylaxis  heparin  Lab Results  Component Value Date   PLT 146* 04/26/2015    Medications  Scheduled Meds: . aspirin EC  81 mg Oral Daily  . citalopram  10 mg Oral Daily  . doxycycline  100 mg Oral Q12H  . enoxaparin (LOVENOX) injection  30 mg Subcutaneous Q24H  . ferrous sulfate  325 mg Oral BID  . furosemide  80 mg Intravenous BID  . insulin aspart  0-15 Units Subcutaneous TID WC  .  insulin glargine  12 Units Subcutaneous q morning - 10a  . rosuvastatin  5 mg Oral QPM  . sodium chloride  3 mL Intravenous Q12H   Continuous Infusions:  PRN Meds:.sodium chloride, acetaminophen, ondansetron (ZOFRAN) IV, polyvinyl alcohol, sodium chloride  Antibiotics    Anti-infectives    Start     Dose/Rate Route Frequency Ordered Stop   04/24/15 1045  doxycycline (VIBRA-TABS) tablet 100 mg     100 mg Oral Every 12 hours 04/24/15 1031        Subjective:   Angela Stuart seen and examined today.  Patient states she is feeling better today. Feels breathing has improved.  No change in leg swelling. Denies chest pain,  abdominal pain, nausea, vomiting, diarrhea.    Objective:   Filed Vitals:   04/25/15 0426 04/25/15 1347 04/25/15 2100 04/26/15 0400  BP: 126/65 105/67 123/57   Pulse: 87 69 88   Temp: 97.6 F (36.4 C) 97.5 F (36.4 C) 97.8 F (36.6 C)   TempSrc: Oral Axillary Oral   Resp: 20 20 20    Height:      Weight: 75.388 kg (166 lb 3.2 oz)   75.569 kg (166 lb 9.6 oz)  SpO2: 98% 100% 98%     Wt Readings from Last 3 Encounters:  04/26/15 75.569 kg (166 lb 9.6 oz)     Intake/Output Summary (Last 24 hours) at 04/26/15 1200 Last data filed at 04/26/15 0943  Gross per 24 hour  Intake    920 ml  Output   1400 ml  Net   -480 ml    Exam  General: Well developed, well nourished, NAD  HEENT: NCAT, mucous membranes moist.   Cardiovascular: S1 S2 auscultated, RRR, soft SEM  Respiratory: Clear to auscultation bilaterally  Abdomen: Soft, nontender, nondistended, + bowel sounds  Extremities: warm dry without cyanosis clubbing. 2+ edema in LE B/L. Erythema RLE-unchanged  Neuro: AAOx3, nonfocal  Psych: Normal affect and demeanor, pleasant   Data Review   Micro Results Recent Results (from the past 240 hour(s))  MRSA PCR Screening     Status: None   Collection Time: 04/21/15 10:55 PM  Result Value Ref Range Status   MRSA by PCR NEGATIVE NEGATIVE Final    Comment:        The GeneXpert MRSA Assay (FDA approved for NASAL specimens only), is one component of a comprehensive MRSA colonization surveillance program. It is not intended to diagnose MRSA infection nor to guide or monitor treatment for MRSA infections.     Radiology Reports Dg Chest 2 View  04/21/2015  CLINICAL DATA:  Lower extremity swelling and edema for 2 months. EXAM: CHEST  2 VIEW COMPARISON:  None. FINDINGS: Low lung volumes. The lungs are clear wiithout focal pneumonia, edema, pneumothorax or pleural effusion. Interstitial markings are diffusely coarsened with chronic features. There is pulmonary vascular  congestion without overt pulmonary edema. The cardio pericardial silhouette is enlarged. Patient is status post median sternotomy Bones are diffusely demineralized. IMPRESSION: Enlargement of the cardiopericardial silhouette. Cardiomegaly versus pericardial effusion. Vascular congestion without overt edema. Electronically Signed   By: Kennith CenterEric  Mansell M.D.   On: 04/21/2015 19:28   [place] Renal  04/24/2015  CLINICAL DATA:  Chronic kidney disease. EXAM: RENAL / URINARY TRACT ULTRASOUND COMPLETE COMPARISON:  None. FINDINGS: Right Kidney: Length: 10.0 cm. Normal parenchymal echogenicity. 2.7 cm cortical cyst arises from the upper pole. No other renal masses, no stones and no hydronephrosis. Left Kidney: Length: 10.2 cm. Echogenicity within normal limits.  No mass or hydronephrosis visualized. Bladder: Appears normal for degree of bladder distention. Note is made of small to moderate amount of ascites. Liver appears nodular with a coarsened echotexture suggesting cirrhosis. IMPRESSION: 1. Normal renal sizes and parenchymal echogenicity. No hydronephrosis. Right renal cyst. No other masses. 2. Ascites.  Liver appearance raises suspicion for cirrhosis. Electronically Signed   By: Amie Portland M.D.   On: 04/24/2015 11:56    CBC  Recent Labs Lab 04/21/15 1938 04/23/15 0451 04/25/15 0230 04/26/15 0337  WBC 7.7 7.1 7.4 7.1  HGB 12.9 12.6 12.6 12.0  HCT 40.9 40.2 39.6 38.4  PLT 172 150 151 146*  MCV 89.5 89.9 90.2 88.7  MCH 28.2 28.2 28.7 27.7  MCHC 31.5 31.3 31.8 31.3  RDW 14.8 14.9 15.1 14.9  LYMPHSABS 1.5  --   --   --   MONOABS 0.5  --   --   --   EOSABS 0.2  --   --   --   BASOSABS 0.0  --   --   --     Chemistries   Recent Labs Lab 04/22/15 0323 04/23/15 0451 04/23/15 1143 04/24/15 0319 04/24/15 1151 04/25/15 0230 04/26/15 0337  NA 138 137  --  139  --  136 138  K 4.6 3.9  --  3.8  --  3.6 3.3*  CL 107 107  --  105  --  105 106  CO2 23 23  --  22  --  22 25  GLUCOSE 259* 76  --  153*   --  188* 150*  BUN 49* 49*  --  50*  --  47* 45*  CREATININE 1.53* 1.61*  --  1.59*  --  1.58* 1.50*  CALCIUM 9.2 9.1  --  9.1  --  8.9 8.8*  MG  --   --  1.8  --   --   --   --   AST  --   --   --   --  27  --   --   ALT  --   --   --   --  13*  --   --   ALKPHOS  --   --   --   --  70  --   --   BILITOT  --   --   --   --  1.2  --   --    ------------------------------------------------------------------------------------------------------------------ estimated creatinine clearance is 30.6 mL/min (by C-G formula based on Cr of 1.5). ------------------------------------------------------------------------------------------------------------------  Recent Labs  04/24/15 1151  HGBA1C 8.2*   ------------------------------------------------------------------------------------------------------------------ No results for input(s): CHOL, HDL, LDLCALC, TRIG, CHOLHDL, LDLDIRECT in the last 72 hours. ------------------------------------------------------------------------------------------------------------------  Recent Labs  04/24/15 1151  TSH 5.028*   ------------------------------------------------------------------------------------------------------------------ No results for input(s): VITAMINB12, FOLATE, FERRITIN, TIBC, IRON, RETICCTPCT in the last 72 hours.  Coagulation profile No results for input(s): INR, PROTIME in the last 168 hours.  No results for input(s): DDIMER in the last 72 hours.  Cardiac Enzymes  Recent Labs Lab 04/21/15 2334 04/22/15 0323 04/22/15 0935  TROPONINI 0.16* 0.15* 0.13*   ------------------------------------------------------------------------------------------------------------------ Invalid input(s): POCBNP    [place] Current D.O. on 04/26/2015 at 12:00 PM  Between 7am to 7pm - Pager - [phone]  After 7pm go to [email or web address] - password [email or web address]  And look for the night coverage person covering for me after hours  [facility]  Group Office  [phone]

## 2015-04-26 NOTE — Consult Note (Signed)
Ref: GREEN, Lenon CurtARTHUR G, MD   Subjective:  Feeling better. Slow and steady diuresis. Agrees to limited cardiac work up at this time.  Objective:  Vital Signs in the last 24 hours: Temp:  [97.8 F (36.6 C)] 97.8 F (36.6 C) (01/15 2100) Pulse Rate:  [86-88] 86 (01/16 1240) Cardiac Rhythm:  [-] Other (Comment) (01/16 0700) Resp:  [20] 20 (01/16 1240) BP: (122-123)/(57-72) 122/72 mmHg (01/16 1240) SpO2:  [98 %-100 %] 100 % (01/16 1240) Weight:  [75.569 kg (166 lb 9.6 oz)] 75.569 kg (166 lb 9.6 oz) (01/16 0400)  Physical Exam: BP Readings from Last 1 Encounters:  04/26/15 122/72    Wt Readings from Last 1 Encounters:  04/26/15 75.569 kg (166 lb 9.6 oz)    Weight change: -0.531 kg (-1 lb 2.7 oz)  HEENT: [place]/AT, Eyes-PERL, EOMI, Conjunctiva-Pink, Sclera-Non-icteric Neck: + JVD, No bruit, Trachea midline. Lungs:  Clearing, Bilateral. Cardiac:  Regular rhythm, normal S1 and S2, no S3. II/VI systolic murmur. Abdomen:  Soft, non-tender. Extremities:  1-2 + lower leg edema present. No cyanosis. No clubbing. CNS: AxOx3, Cranial nerves grossly intact, moves all 4 extremities. Right handed. Skin: Warm and dry.   Intake/Output from previous day: 01/15 0701 - 01/16 0700 In: 923 [P.O.:920; I.V.:3] Out: 1100 [Urine:1100]    Lab Results: BMET    Component Value Date/Time   NA 138 04/26/2015 0337   NA 136 04/25/2015 0230   NA 139 04/24/2015 0319   K 3.3* 04/26/2015 0337   K 3.6 04/25/2015 0230   K 3.8 04/24/2015 0319   CL 106 04/26/2015 0337   CL 105 04/25/2015 0230   CL 105 04/24/2015 0319   CO2 25 04/26/2015 0337   CO2 22 04/25/2015 0230   CO2 22 04/24/2015 0319   GLUCOSE 150* 04/26/2015 0337   GLUCOSE 188* 04/25/2015 0230   GLUCOSE 153* 04/24/2015 0319   BUN 45* 04/26/2015 0337   BUN 47* 04/25/2015 0230   BUN 50* 04/24/2015 0319   CREATININE 1.50* 04/26/2015 0337   CREATININE 1.58* 04/25/2015 0230   CREATININE 1.59* 04/24/2015 0319   CALCIUM 8.8* 04/26/2015 0337   CALCIUM 8.9 04/25/2015 0230   CALCIUM 9.1 04/24/2015 0319   GFRNONAA 31* 04/26/2015 0337   GFRNONAA 30* 04/25/2015 0230   GFRNONAA 29* 04/24/2015 0319   GFRAA 36* 04/26/2015 0337   GFRAA 34* 04/25/2015 0230   GFRAA 34* 04/24/2015 0319   CBC    Component Value Date/Time   WBC 7.1 04/26/2015 0337   RBC 4.33 04/26/2015 0337   HGB 12.0 04/26/2015 0337   HCT 38.4 04/26/2015 0337   PLT 146* 04/26/2015 0337   MCV 88.7 04/26/2015 0337   MCH 27.7 04/26/2015 0337   MCHC 31.3 04/26/2015 0337   RDW 14.9 04/26/2015 0337   LYMPHSABS 1.5 04/21/2015 1938   MONOABS 0.5 04/21/2015 1938   EOSABS 0.2 04/21/2015 1938   BASOSABS 0.0 04/21/2015 1938   HEPATIC Function Panel  Recent Labs  04/24/15 1151  PROT 6.3*   HEMOGLOBIN A1C No components found for: HGA1C,  MPG CARDIAC ENZYMES Lab Results  Component Value Date   TROPONINI 0.13* 04/22/2015   TROPONINI 0.15* 04/22/2015   TROPONINI 0.16* 04/21/2015   BNP No results for input(s): PROBNP in the last 8760 hours. TSH  Recent Labs  04/24/15 1151  TSH 5.028*   CHOLESTEROL No results for input(s): CHOL in the last 8760 hours.  Scheduled Meds: . aspirin EC  81 mg Oral Daily  . citalopram  10 mg Oral  Daily  . doxycycline  100 mg Oral Q12H  . enoxaparin (LOVENOX) injection  30 mg Subcutaneous Q24H  . ferrous sulfate  325 mg Oral BID  . furosemide  80 mg Intravenous BID  . hydrocortisone   Rectal TID  . insulin aspart  0-15 Units Subcutaneous TID WC  . insulin glargine  12 Units Subcutaneous q morning - 10a  . rosuvastatin  5 mg Oral QPM  . sodium chloride  3 mL Intravenous Q12H   Continuous Infusions:  PRN Meds:.sodium chloride, acetaminophen, ondansetron (ZOFRAN) IV, polyvinyl alcohol, sodium chloride  Assessment/Plan: Acute on chronic left heart systolic failure Acute kidney injury vs CKD Abnormal troponin-I from renal insufficiency or demand ischemia DM, II Dyslipidemia Depression Hypothyroidism unlikely with elevated  free T 4 and borderline TSH elevation in sick patient. Hypoalbuminemia  Nuclear stress test in AM.     LOS: 5 days    Orpah Cobb  MD  04/26/2015, 4:08 PM

## 2015-04-27 ENCOUNTER — Other Ambulatory Visit (HOSPITAL_COMMUNITY): Payer: Medicare Other

## 2015-04-27 ENCOUNTER — Inpatient Hospital Stay (HOSPITAL_COMMUNITY): Payer: Medicare Other

## 2015-04-27 LAB — BASIC METABOLIC PANEL
ANION GAP: 9 (ref 5–15)
BUN: 43 mg/dL — ABNORMAL HIGH (ref 6–20)
CALCIUM: 9 mg/dL (ref 8.9–10.3)
CHLORIDE: 105 mmol/L (ref 101–111)
CO2: 23 mmol/L (ref 22–32)
CREATININE: 1.37 mg/dL — AB (ref 0.44–1.00)
GFR calc non Af Amer: 35 mL/min — ABNORMAL LOW (ref >60)
GFR, EST AFRICAN AMERICAN: 41 mL/min — AB (ref >60)
Glucose, Bld: 117 mg/dL — ABNORMAL HIGH (ref 65–99)
Potassium: 3.7 mmol/L (ref 3.5–5.1)
SODIUM: 137 mmol/L (ref 135–145)

## 2015-04-27 LAB — CBC
HEMATOCRIT: 39.1 % (ref 36.0–46.0)
HEMOGLOBIN: 12.3 g/dL (ref 12.0–15.0)
MCH: 27.9 pg (ref 26.0–34.0)
MCHC: 31.5 g/dL (ref 30.0–36.0)
MCV: 88.7 fL (ref 78.0–100.0)
Platelets: 137 10*3/uL — ABNORMAL LOW (ref 150–400)
RBC: 4.41 MIL/uL (ref 3.87–5.11)
RDW: 15.1 % (ref 11.5–15.5)
WBC: 6.7 10*3/uL (ref 4.0–10.5)

## 2015-04-27 LAB — MAGNESIUM: MAGNESIUM: 1.5 mg/dL — AB (ref 1.7–2.4)

## 2015-04-27 LAB — GLUCOSE, CAPILLARY
GLUCOSE-CAPILLARY: 117 mg/dL — AB (ref 65–99)
Glucose-Capillary: 123 mg/dL — ABNORMAL HIGH (ref 65–99)
Glucose-Capillary: 164 mg/dL — ABNORMAL HIGH (ref 65–99)
Glucose-Capillary: 182 mg/dL — ABNORMAL HIGH (ref 65–99)

## 2015-04-27 MED ORDER — MAGNESIUM SULFATE 2 GM/50ML IV SOLN
2.0000 g | Freq: Once | INTRAVENOUS | Status: AC
  Administered 2015-04-27: 2 g via INTRAVENOUS
  Filled 2015-04-27: qty 50

## 2015-04-27 MED ORDER — ENOXAPARIN SODIUM 40 MG/0.4ML ~~LOC~~ SOLN
40.0000 mg | SUBCUTANEOUS | Status: DC
  Administered 2015-04-27: 40 mg via SUBCUTANEOUS
  Filled 2015-04-27: qty 0.4

## 2015-04-27 MED ORDER — TECHNETIUM TC 99M SESTAMIBI GENERIC - CARDIOLITE
10.0000 | Freq: Once | INTRAVENOUS | Status: AC | PRN
  Administered 2015-04-27: 10 via INTRAVENOUS

## 2015-04-27 MED ORDER — ACETAMINOPHEN 325 MG PO TABS
ORAL_TABLET | ORAL | Status: AC
  Filled 2015-04-27: qty 2

## 2015-04-27 MED ORDER — TECHNETIUM TC 99M SESTAMIBI GENERIC - CARDIOLITE
30.0000 | Freq: Once | INTRAVENOUS | Status: AC | PRN
  Administered 2015-04-27: 30 via INTRAVENOUS

## 2015-04-27 MED ORDER — PROMETHAZINE HCL 25 MG/ML IJ SOLN
12.5000 mg | Freq: Four times a day (QID) | INTRAMUSCULAR | Status: DC | PRN
  Administered 2015-04-27 – 2015-04-28 (×3): 12.5 mg via INTRAVENOUS
  Filled 2015-04-27 (×2): qty 1

## 2015-04-27 MED ORDER — ONDANSETRON HCL 4 MG/2ML IJ SOLN
INTRAMUSCULAR | Status: AC
  Filled 2015-04-27: qty 2

## 2015-04-27 MED ORDER — REGADENOSON 0.4 MG/5ML IV SOLN
0.4000 mg | Freq: Once | INTRAVENOUS | Status: AC
  Administered 2015-04-27: 0.4 mg via INTRAVENOUS
  Filled 2015-04-27: qty 5

## 2015-04-27 MED ORDER — REGADENOSON 0.4 MG/5ML IV SOLN
INTRAVENOUS | Status: AC
  Administered 2015-04-27: 0.4 mg via INTRAVENOUS
  Filled 2015-04-27: qty 5

## 2015-04-27 NOTE — Consult Note (Signed)
Ref: GREEN, Lenon Curt, MD   Subjective:  Discussed abnormal stress test. Vital signs stable.  Objective:  Vital Signs in the last 24 hours: Temp:  [97.5 F (36.4 C)-98.1 F (36.7 C)] 97.5 F (36.4 C) (01/17 1010) Pulse Rate:  [73-102] 102 (01/17 1123) Cardiac Rhythm:  [-] Other (Comment) (01/17 0700) Resp:  [20] 20 (01/17 1123) BP: (99-132)/(57-90) 99/61 mmHg (01/17 1123) SpO2:  [97 %-100 %] 100 % (01/17 1123) Weight:  [76.4 kg (168 lb 6.9 oz)] 76.4 kg (168 lb 6.9 oz) (01/17 0544)  Physical Exam: BP Readings from Last 1 Encounters:  04/27/15 99/61    Wt Readings from Last 1 Encounters:  04/27/15 76.4 kg (168 lb 6.9 oz)    Weight change: 0.831 kg (1 lb 13.3 oz)  HEENT: [place]/AT, Eyes-PERL, EOMI, Conjunctiva-Pink, Sclera-Non-icteric Neck: No JVD, No bruit, Trachea midline. Lungs:  Clearing, Bilateral. Cardiac:  Regular rhythm, normal S1 and S2, no S3. II/VI systolic murmur. Abdomen:  Soft, non-tender. Extremities:  1-2 + edema present. No cyanosis. No clubbing. CNS: AxOx3, Cranial nerves grossly intact, moves all 4 extremities. Right handed. Skin: Warm and dry.   Intake/Output from previous day: 01/16 0701 - 01/17 0700 In: 480 [P.O.:480] Out: 1161 [Urine:1160; Stool:1]    Lab Results: BMET    Component Value Date/Time   NA 137 04/27/2015 0530   NA 138 04/26/2015 0337   NA 136 04/25/2015 0230   K 3.7 04/27/2015 0530   K 3.3* 04/26/2015 0337   K 3.6 04/25/2015 0230   CL 105 04/27/2015 0530   CL 106 04/26/2015 0337   CL 105 04/25/2015 0230   CO2 23 04/27/2015 0530   CO2 25 04/26/2015 0337   CO2 22 04/25/2015 0230   GLUCOSE 117* 04/27/2015 0530   GLUCOSE 150* 04/26/2015 0337   GLUCOSE 188* 04/25/2015 0230   BUN 43* 04/27/2015 0530   BUN 45* 04/26/2015 0337   BUN 47* 04/25/2015 0230   CREATININE 1.37* 04/27/2015 0530   CREATININE 1.50* 04/26/2015 0337   CREATININE 1.58* 04/25/2015 0230   CALCIUM 9.0 04/27/2015 0530   CALCIUM 8.8* 04/26/2015 0337   CALCIUM 8.9  04/25/2015 0230   GFRNONAA 35* 04/27/2015 0530   GFRNONAA 31* 04/26/2015 0337   GFRNONAA 30* 04/25/2015 0230   GFRAA 41* 04/27/2015 0530   GFRAA 36* 04/26/2015 0337   GFRAA 34* 04/25/2015 0230   CBC    Component Value Date/Time   WBC 6.7 04/27/2015 0530   RBC 4.41 04/27/2015 0530   HGB 12.3 04/27/2015 0530   HCT 39.1 04/27/2015 0530   PLT 137* 04/27/2015 0530   MCV 88.7 04/27/2015 0530   MCH 27.9 04/27/2015 0530   MCHC 31.5 04/27/2015 0530   RDW 15.1 04/27/2015 0530   LYMPHSABS 1.5 04/21/2015 1938   MONOABS 0.5 04/21/2015 1938   EOSABS 0.2 04/21/2015 1938   BASOSABS 0.0 04/21/2015 1938   HEPATIC Function Panel  Recent Labs  04/24/15 1151  PROT 6.3*   HEMOGLOBIN A1C No components found for: HGA1C,  MPG CARDIAC ENZYMES Lab Results  Component Value Date   TROPONINI 0.13* 04/22/2015   TROPONINI 0.15* 04/22/2015   TROPONINI 0.16* 04/21/2015   BNP No results for input(s): PROBNP in the last 8760 hours. TSH  Recent Labs  04/24/15 1151  TSH 5.028*   CHOLESTEROL No results for input(s): CHOL in the last 8760 hours.  Scheduled Meds: . acetaminophen      . aspirin EC  81 mg Oral Daily  . citalopram  10 mg  Oral Daily  . doxycycline  100 mg Oral Q12H  . enoxaparin (LOVENOX) injection  40 mg Subcutaneous Q24H  . ferrous sulfate  325 mg Oral BID  . furosemide  80 mg Intravenous BID  . hydrocortisone   Rectal TID  . insulin aspart  0-15 Units Subcutaneous TID WC  . insulin glargine  12 Units Subcutaneous q morning - 10a  . ondansetron      . rosuvastatin  5 mg Oral QPM  . sodium chloride  3 mL Intravenous Q12H   Continuous Infusions:  PRN Meds:.sodium chloride, acetaminophen, ondansetron (ZOFRAN) IV, polyvinyl alcohol, promethazine, sodium chloride  Assessment/Plan: Acute on chronic left heart systolic failure Ischemic cardiomyopathy Acute kidney injury vs CKD Abnormal troponin-I from renal insufficiency or demand ischemia DM,  II Dyslipidemia Depression Hypothyroidism unlikely with elevated free T 4 and borderline TSH elevation in sick patient. Hypoalbuminemia   Offered cardiac cath, discussed procedure and risks and medical therapy as alternative. Patient to let me know in AM after discussion with family members.   LOS: 6 days    Orpah Cobb  MD  04/27/2015, 6:07 PM

## 2015-04-27 NOTE — Progress Notes (Signed)
Pt had run 9 beats of VTach, asymptomatic. K.Kirby was notified. Will continue to monitor pt

## 2015-04-27 NOTE — Progress Notes (Signed)
PT Cancellation Note  Patient Details Name: Angela Stuart MRN: [id number] DOB: February 09, 1932   Cancelled Treatment:    Reason Eval/Treat Not Completed: Fatigue/lethargy limiting ability to participate; patient just back from stress test this AM and had some N&V.  Will attempt to see another day.   Elray Mcgregor 04/27/2015, 11:38 AM  Sheran Lawless, PT [phone] 04/27/2015

## 2015-04-27 NOTE — Progress Notes (Addendum)
[facility]                                                                              Patient Demographics  Angela Stuart, is a 82 y.o. female, DOB - 1931/05/22, JYN:[id number]  Admit date - 04/21/2015   Admitting Physician Hillary Bow, DO  Outpatient Primary MD for the patient is GREEN, Lenon Curt, MD  LOS - 6   Chief Complaint  Patient presents with  . Leg Pain      HPI on 04/21/2015 by Dr. Lyda Perone Angela Stuart is a 82 y.o. female with h/o CHF, CABG, DM, HTN. Patient presents to the ED with several week history of worsening peripheral edema and generalized weakness and fatigue. This became severe enough today that she could not walk per family. She recently moved to area from [place]. Symptoms are not being helped by her home Lasix which she takes once a day at noon (dose unknown). She denies any chest pain, no change in urination.  Assessment & Plan   Acute Combined systolic/diastolic CHF exacerbation -CXR: Enlargement of cardiopericardial silhouette, cardiomegaly versus pericardial effusion. Vascular congestion without overt edema -Echocardiogram: EF 35-40%, diastolic dysfunction, Severe MR posteriorly  -BNP >4500 -Continue lasix, increased by cardiology  IV BID -Monitor daily weights, intake/output -Cardiology consulted and appreciated -No ACEI given AKI vs CKD -UOP 1160cc over past 24 hours -Pending lexiscan scan today  Venous insuff. Lower extremity vs cellulitis -RLE appears to have erythema, patient currently afebrile, no leukocytosis. States it has been red for quite some time -Unlikely cellulitis, continue to monitor- erythema not improving,  Continue doxycycline -LE Doppler: negative for DVT  Elevated troponin -Likely secondary to demand ischemia vs CHF exac -Remaining fairly flat.  Acute kidney injury vs CKD -No previous Creatinine in the system -metformin held -Pending records -Cr 1.37 today, likely due to diuresis vs  CKD -Continue to monitor BMP -Renal US: normal renal sizes and parenchymal echogenicity, no hydronephrosis.  Ascites, liver appearance raises ?cirrhosis   Diabetes mellitus, type 2 -Continue home lantus, ISS, and CBG monitoring -Metformin held -Hemoglobin A1c 8.2 -Diabetes coordinator consulted  Hypokalemia -Likely secondary to diuresis -Replace and continue to monitor BMP  Depression  -Continue Celexa  Hyperlipidemia -Continue statin  Constipation -Patient was able to have BM   ?Cirrhosis  -Found on renal US -LFTs nornmal  Abnormal TSH -TSH 5.028, FT4 1.59 -Patient should have repeat thyroid test after acute illness has resolved  Physical deconditioning -PT consulted   Nausea  -Possibly secondary to lexiscan scan -Continue antiemetics   Code Status: Full  Family Communication: Son at bedside.    Disposition Plan: Admitted. Suspect home within 24-48 hours  Time Spent in minutes   30 minutes  Procedures  Echocardiogram Lexiscan  Consults   Cardiology  DVT Prophylaxis  heparin  Lab Results  Component Value Date   PLT 137* 04/27/2015    Medications  Scheduled Meds: . acetaminophen      . aspirin EC  81 mg Oral Daily  . citalopram  10 mg Oral Daily  . doxycycline  100 mg Oral Q12H  . enoxaparin (LOVENOX) injection  40 mg Subcutaneous Q24H  . ferrous sulfate  325 mg Oral BID  . furosemide  80 mg Intravenous BID  . hydrocortisone   Rectal TID  . insulin aspart  0-15 Units Subcutaneous TID WC  . insulin glargine  12 Units Subcutaneous q morning - 10a  . ondansetron      . rosuvastatin  5 mg Oral QPM  . sodium chloride  3 mL Intravenous Q12H   Continuous Infusions:  PRN Meds:.sodium chloride, acetaminophen, ondansetron (ZOFRAN) IV, polyvinyl alcohol, promethazine, sodium chloride  Antibiotics    Anti-infectives    Start     Dose/Rate Route Frequency Ordered Stop   04/24/15 1045  doxycycline (VIBRA-TABS) tablet 100 mg     100 mg Oral Every  12 hours 04/24/15 1031        Subjective:   Angela Stuart seen and examined today.  Patient complains of nausea. Denies chest pain, abdominal pain, nausea, vomiting, diarrhea.    Objective:   Filed Vitals:   04/27/15 0913 04/27/15 0915 04/27/15 1010 04/27/15 1123  BP: 111/66 110/67 118/90 99/61  Pulse: 82 90 92 102  Temp:   97.5 F (36.4 C)   TempSrc:   Oral   Resp:   20 20  Height:      Weight:      SpO2:   100% 100%    Wt Readings from Last 3 Encounters:  04/27/15 76.4 kg (168 lb 6.9 oz)     Intake/Output Summary (Last 24 hours) at 04/27/15 1235 Last data filed at 04/27/15 1133  Gross per 24 hour  Intake    240 ml  Output    862 ml  Net   -622 ml    Exam  General: Well developed, well nourished, NAD  HEENT: NCAT, mucous membranes moist.   Cardiovascular: S1 S2 auscultated, RRR, soft SEM  Respiratory: Clear to auscultation   Abdomen: Soft, nontender, nondistended, + bowel sounds  Extremities: warm dry without cyanosis clubbing. 2+ edema in LE B/L. Erythema RLE-unchanged  Neuro: AAOx3, nonfocal  Psych: Normal affect and demeanor  Data Review   Micro Results Recent Results (from the past 240 hour(s))  MRSA PCR Screening     Status: None   Collection Time: 04/21/15 10:55 PM  Result Value Ref Range Status   MRSA by PCR NEGATIVE NEGATIVE Final    Comment:        The GeneXpert MRSA Assay (FDA approved for NASAL specimens only), is one component of a comprehensive MRSA colonization surveillance program. It is not intended to diagnose MRSA infection nor to guide or monitor treatment for MRSA infections.     Radiology Reports Dg Chest 2 View  04/21/2015  CLINICAL DATA:  Lower extremity swelling and edema for 2 months. EXAM: CHEST  2 VIEW COMPARISON:  None. FINDINGS: Low lung volumes. The lungs are clear wiithout focal pneumonia, edema, pneumothorax or pleural effusion. Interstitial markings are diffusely coarsened with chronic features. There is  pulmonary vascular congestion without overt pulmonary edema. The cardio pericardial silhouette is enlarged. Patient is status post median sternotomy Bones are diffusely demineralized. IMPRESSION: Enlargement of the cardiopericardial silhouette. Cardiomegaly versus pericardial effusion. Vascular congestion without overt edema. Electronically Signed   By: Kennith Center M.D.   On: 04/21/2015 19:28   US Renal  04/24/2015  CLINICAL DATA:  Chronic kidney disease. EXAM: RENAL / URINARY TRACT ULTRASOUND COMPLETE COMPARISON:  None. FINDINGS: Right Kidney: Length: 10.0 cm. Normal parenchymal echogenicity. 2.7 cm cortical cyst arises from the upper pole. No other renal masses, no stones and no hydronephrosis.  Left Kidney: Length: 10.2 cm. Echogenicity within normal limits. No mass or hydronephrosis visualized. Bladder: Appears normal for degree of bladder distention. Note is made of small to moderate amount of ascites. Liver appears nodular with a coarsened echotexture suggesting cirrhosis. IMPRESSION: 1. Normal renal sizes and parenchymal echogenicity. No hydronephrosis. Right renal cyst. No other masses. 2. Ascites.  Liver appearance raises suspicion for cirrhosis. Electronically Signed   By: Amie Portland M.D.   On: 04/24/2015 11:56   Nm Myocar Multi W/spect W/wall Motion / Ef  04/27/2015  CLINICAL DATA:  History of Coronary artery disease, bypass surgery, myocardial infarctions, diabetes, hypertension and CHF. EXAM: MYOCARDIAL IMAGING WITH SPECT (REST AND PHARMACOLOGIC-STRESS) GATED LEFT VENTRICULAR WALL MOTION STUDY LEFT VENTRICULAR EJECTION FRACTION TECHNIQUE: Standard myocardial SPECT imaging was performed after resting intravenous injection of 10 mCi Tc-42m sestamibi. Subsequently, intravenous infusion of Lexiscan was performed under the supervision of the Cardiology staff. At peak effect of the drug, 30 mCi Tc-66m sestamibi was injected intravenously and standard myocardial SPECT imaging was performed.  Quantitative gated imaging was also performed to evaluate left ventricular wall motion, and estimate left ventricular ejection fraction. COMPARISON:  None. FINDINGS: Perfusion: Small reversible defect involving the low anterior wall consistent with ischemia. Wall Motion: Left ventricular dilatation and global hypokinesis. Left Ventricular Ejection Fraction: 21 % End diastolic volume 185 ml End systolic volume 145 ml IMPRESSION: 1. Low anterior wall ischemia. 2. Global hypokinesis. 3. Left ventricular ejection fraction 21% 4. High-risk stress test findings*. *2012 Appropriate Use Criteria for Coronary Revascularization Focused Update: J Am Coll Cardiol. 2012;59(9):857-881. http://content.dementiazones.com.aspx?articleid=[email or web address] Electronically Signed   By: Rudie Meyer M.D.   On: 04/27/2015 11:59    CBC  Recent Labs Lab 04/21/15 1938 04/23/15 0451 04/25/15 0230 04/26/15 0337 04/27/15 0530  WBC 7.7 7.1 7.4 7.1 6.7  HGB 12.9 12.6 12.6 12.0 12.3  HCT 40.9 40.2 39.6 38.4 39.1  PLT 172 150 151 146* 137*  MCV 89.5 89.9 90.2 88.7 88.7  MCH 28.2 28.2 28.7 27.7 27.9  MCHC 31.5 31.3 31.8 31.3 31.5  RDW 14.8 14.9 15.1 14.9 15.1  LYMPHSABS 1.5  --   --   --   --   MONOABS 0.5  --   --   --   --   EOSABS 0.2  --   --   --   --   BASOSABS 0.0  --   --   --   --     Chemistries   Recent Labs Lab 04/23/15 0451 04/23/15 1143 04/24/15 0319 04/24/15 1151 04/25/15 0230 04/26/15 0337 04/27/15 0530  NA 137  --  139  --  136 138 137  K 3.9  --  3.8  --  3.6 3.3* 3.7  CL 107  --  105  --  105 106 105  CO2 23  --  22  --  GLUCOSE 76  --  153*  --  188* 150* 117*  BUN 49*  --  50*  --  47* 45* 43*  CREATININE 1.61*  --  1.59*  --  1.58* 1.50* 1.37*  CALCIUM 9.1  --  9.1  --  8.9 8.8* 9.0  MG  --  1.8  --   --   --   --  1.5*  AST  --   --   --  27  --   --   --   ALT  --   --   --  13*  --   --   --  ALKPHOS  --   --   --  70  --   --   --   BILITOT  --   --   --  1.2  --   --    --    ------------------------------------------------------------------------------------------------------------------ estimated creatinine clearance is 33.6 mL/min (by C-G formula based on Cr of 1.37). ------------------------------------------------------------------------------------------------------------------ No results for input(s): HGBA1C in the last 72 hours. ------------------------------------------------------------------------------------------------------------------ No results for input(s): CHOL, HDL, LDLCALC, TRIG, CHOLHDL, LDLDIRECT in the last 72 hours. ------------------------------------------------------------------------------------------------------------------ No results for input(s): TSH, T4TOTAL, T3FREE, THYROIDAB in the last 72 hours.  Invalid input(s): FREET3 ------------------------------------------------------------------------------------------------------------------ No results for input(s): VITAMINB12, FOLATE, FERRITIN, TIBC, IRON, RETICCTPCT in the last 72 hours.  Coagulation profile No results for input(s): INR, PROTIME in the last 168 hours.  No results for input(s): DDIMER in the last 72 hours.  Cardiac Enzymes  Recent Labs Lab 04/21/15 2334 04/22/15 0323 04/22/15 0935  TROPONINI 0.16* 0.15* 0.13*   ------------------------------------------------------------------------------------------------------------------ Invalid input(s): POCBNP    Skyra Crichlow D.O. on 04/27/2015 at 12:35 PM  Between 7am to 7pm - Pager - [phone]  After 7pm go to [email or web address] - password [email or web address]  And look for the night coverage person covering for me after hours  [facility] Office  [phone]

## 2015-04-28 ENCOUNTER — Inpatient Hospital Stay (HOSPITAL_COMMUNITY): Payer: Medicare Other

## 2015-04-28 ENCOUNTER — Ambulatory Visit: Payer: Self-pay | Admitting: Internal Medicine

## 2015-04-28 LAB — GLUCOSE, CAPILLARY
GLUCOSE-CAPILLARY: 102 mg/dL — AB (ref 65–99)
GLUCOSE-CAPILLARY: 96 mg/dL (ref 65–99)
GLUCOSE-CAPILLARY: 118 mg/dL — AB (ref 65–99)
Glucose-Capillary: 90 mg/dL (ref 65–99)

## 2015-04-28 LAB — BASIC METABOLIC PANEL
ANION GAP: 10 (ref 5–15)
BUN: 51 mg/dL — ABNORMAL HIGH (ref 6–20)
CHLORIDE: 106 mmol/L (ref 101–111)
CO2: 22 mmol/L (ref 22–32)
Calcium: 9.6 mg/dL (ref 8.9–10.3)
Creatinine, Ser: 1.7 mg/dL — ABNORMAL HIGH (ref 0.44–1.00)
GFR calc non Af Amer: 27 mL/min — ABNORMAL LOW (ref >60)
GFR, EST AFRICAN AMERICAN: 31 mL/min — AB (ref >60)
Glucose, Bld: 100 mg/dL — ABNORMAL HIGH (ref 65–99)
POTASSIUM: 4 mmol/L (ref 3.5–5.1)
SODIUM: 138 mmol/L (ref 135–145)

## 2015-04-28 LAB — NM MYOCAR MULTI W/SPECT W/WALL MOTION / EF
CHL CUP STRESS STAGE 1 HR: 83 {beats}/min
CHL CUP STRESS STAGE 1 SBP: 132 mmHg
CHL CUP STRESS STAGE 1 SPEED: 0 mph
CHL CUP STRESS STAGE 2 GRADE: 0 %
CHL CUP STRESS STAGE 2 HR: 84 {beats}/min
CHL CUP STRESS STAGE 2 SPEED: 0 mph
CHL CUP STRESS STAGE 3 DBP: 64 mmHg
CHL CUP STRESS STAGE 3 SBP: 110 mmHg
CHL CUP STRESS STAGE 4 SPEED: 0 mph
CHL CUP STRESS STAGE 5 DBP: 67 mmHg
CHL CUP STRESS STAGE 5 GRADE: 0 %
CHL CUP STRESS STAGE 5 SBP: 110 mmHg
CHL CUP STRESS STAGE 5 SPEED: 0 mph
CSEPPBP: 110 mmHg
CSEPPMHR: 64 %
Estimated workload: 1 METS
Peak HR: 89 {beats}/min
Stage 1 DBP: 76 mmHg
Stage 1 Grade: 0 %
Stage 3 Grade: 0 %
Stage 3 HR: 86 {beats}/min
Stage 3 Speed: 0 mph
Stage 4 DBP: 66 mmHg
Stage 4 Grade: 0 %
Stage 4 HR: 83 {beats}/min
Stage 4 SBP: 111 mmHg
Stage 5 HR: 89 {beats}/min

## 2015-04-28 LAB — CBC
HCT: 41.8 % (ref 36.0–46.0)
HEMOGLOBIN: 13.3 g/dL (ref 12.0–15.0)
MCH: 28.4 pg (ref 26.0–34.0)
MCHC: 31.8 g/dL (ref 30.0–36.0)
MCV: 89.1 fL (ref 78.0–100.0)
Platelets: 172 10*3/uL (ref 150–400)
RBC: 4.69 MIL/uL (ref 3.87–5.11)
RDW: 15.4 % (ref 11.5–15.5)
WBC: 9 10*3/uL (ref 4.0–10.5)

## 2015-04-28 MED ORDER — ENOXAPARIN SODIUM 30 MG/0.3ML ~~LOC~~ SOLN
30.0000 mg | SUBCUTANEOUS | Status: DC
  Administered 2015-04-28 – 2015-05-05 (×8): 30 mg via SUBCUTANEOUS
  Filled 2015-04-28 (×8): qty 0.3

## 2015-04-28 MED ORDER — FUROSEMIDE 40 MG PO TABS
40.0000 mg | ORAL_TABLET | Freq: Two times a day (BID) | ORAL | Status: DC
  Administered 2015-04-29: 40 mg via ORAL
  Filled 2015-04-28: qty 1

## 2015-04-28 NOTE — Consult Note (Signed)
Ref: GREEN, Lenon Curt, MD   Subjective:  Weakness. X-ray hip negative for fracture. Patient and son choose medical therapy as she has tendency to bleed easy.  Objective:  Vital Signs in the last 24 hours: Temp:  [97.5 F (36.4 C)-97.7 F (36.5 C)] 97.5 F (36.4 C) (01/18 1227) Pulse Rate:  [84-95] 86 (01/18 1227) Cardiac Rhythm:  [-] Other (Comment) (01/18 1328) Resp:  [20] 20 (01/18 1227) BP: (106-127)/(50-73) 106/50 mmHg (01/18 1227) SpO2:  [97 %-99 %] 98 % (01/18 1227) Weight:  [72.7 kg (160 lb 4.4 oz)] 72.7 kg (160 lb 4.4 oz) (01/18 0548)  Physical Exam: BP Readings from Last 1 Encounters:  04/28/15 106/50    Wt Readings from Last 1 Encounters:  04/28/15 72.7 kg (160 lb 4.4 oz)    Weight change: -3.7 kg (-8 lb 2.5 oz)  HEENT: [place]/AT, Eyes-PERL, EOMI, Conjunctiva-Pink, Sclera-Non-icteric Neck: No JVD, No bruit, Trachea midline. Lungs:  Clearing, Bilateral. Cardiac:  Regular rhythm, normal S1 and S2, no S3. II/VI systolic murmur. Abdomen:  Soft, non-tender. Extremities:  1 + edema present. No cyanosis. No clubbing. CNS: AxOx3, Cranial nerves grossly intact, moves all 4 extremities. Right handed. Skin: Warm and dry.   Intake/Output from previous day: 01/17 0701 - 01/18 0700 In: 360 [P.O.:360] Out: 508 [Urine:500; Emesis/NG output:1; Stool:7]    Lab Results: BMET    Component Value Date/Time   NA 138 04/28/2015 0356   NA 137 04/27/2015 0530   NA 138 04/26/2015 0337   K 4.0 04/28/2015 0356   K 3.7 04/27/2015 0530   K 3.3* 04/26/2015 0337   CL 106 04/28/2015 0356   CL 105 04/27/2015 0530   CL 106 04/26/2015 0337   CO2 22 04/28/2015 0356   CO2 23 04/27/2015 0530   CO2 25 04/26/2015 0337   GLUCOSE 100* 04/28/2015 0356   GLUCOSE 117* 04/27/2015 0530   GLUCOSE 150* 04/26/2015 0337   BUN 51* 04/28/2015 0356   BUN 43* 04/27/2015 0530   BUN 45* 04/26/2015 0337   CREATININE 1.70* 04/28/2015 0356   CREATININE 1.37* 04/27/2015 0530   CREATININE 1.50* 04/26/2015  0337   CALCIUM 9.6 04/28/2015 0356   CALCIUM 9.0 04/27/2015 0530   CALCIUM 8.8* 04/26/2015 0337   GFRNONAA 27* 04/28/2015 0356   GFRNONAA 35* 04/27/2015 0530   GFRNONAA 31* 04/26/2015 0337   GFRAA 31* 04/28/2015 0356   GFRAA 41* 04/27/2015 0530   GFRAA 36* 04/26/2015 0337   CBC    Component Value Date/Time   WBC 9.0 04/28/2015 0356   RBC 4.69 04/28/2015 0356   HGB 13.3 04/28/2015 0356   HCT 41.8 04/28/2015 0356   PLT 172 04/28/2015 0356   MCV 89.1 04/28/2015 0356   MCH 28.4 04/28/2015 0356   MCHC 31.8 04/28/2015 0356   RDW 15.4 04/28/2015 0356   LYMPHSABS 1.5 04/21/2015 1938   MONOABS 0.5 04/21/2015 1938   EOSABS 0.2 04/21/2015 1938   BASOSABS 0.0 04/21/2015 1938   HEPATIC Function Panel  Recent Labs  04/24/15 1151  PROT 6.3*   HEMOGLOBIN A1C No components found for: HGA1C,  MPG CARDIAC ENZYMES Lab Results  Component Value Date   TROPONINI 0.13* 04/22/2015   TROPONINI 0.15* 04/22/2015   TROPONINI 0.16* 04/21/2015   BNP No results for input(s): PROBNP in the last 8760 hours. TSH  Recent Labs  04/24/15 1151  TSH 5.028*   CHOLESTEROL No results for input(s): CHOL in the last 8760 hours.  Scheduled Meds: . aspirin EC  81 mg Oral  Daily  . citalopram  10 mg Oral Daily  . enoxaparin (LOVENOX) injection  30 mg Subcutaneous Q24H  . ferrous sulfate  325 mg Oral BID  . furosemide  80 mg Intravenous BID  . hydrocortisone   Rectal TID  . insulin aspart  0-15 Units Subcutaneous TID WC  . insulin glargine  12 Units Subcutaneous q morning - 10a  . rosuvastatin  5 mg Oral QPM  . sodium chloride  3 mL Intravenous Q12H   Continuous Infusions:  PRN Meds:.sodium chloride, acetaminophen, ondansetron (ZOFRAN) IV, polyvinyl alcohol, promethazine, sodium chloride  Assessment/Plan: Acute on chronic left heart systolic failure Ischemic cardiomyopathy Acute kidney injury vs CKD Abnormal troponin-I from renal insufficiency or demand ischemia DM,  II Dyslipidemia Depression Hypothyroidism unlikely with elevated free T 4 and borderline TSH elevation in sick patient. Hypoalbuminemia  OK to discharge from cardiology to rehab/SNF till gets strong enough to walk good.  Change Lasix to PO from tomorrow and hold tonight IV dose   LOS: 7 days    Orpah Cobb  MD  04/28/2015, 5:16 PM

## 2015-04-28 NOTE — Progress Notes (Signed)
Worked with patient this am. Attempted stair negotiation with gait belt and rail during therapy session. Patient elevated RLE up on to first step and attempted to ascend, during attempt, patient was unable to carry out elevation and her LEs buckled leaving patient in semi squat position with inability to power up. At that time, this therapist slowly lowered patient to the ground using gait belt and posterior positioning. Upon reaching the ground assisted patient to side-lying position and called for assist to help patient off the floor. 3 person assist utilized to elevate patient to standing and then assisted to chair. Upon return to room, I assessed patient for injury, patient did complain of some right hip discomfort. Notified nursing and physician immediately. Patient was able to stand using RW and ambulate to and from bedside commode after incident without complaints. Formal therapy note to follow.  Charlotte Crumb, PT DPT  [phone]

## 2015-04-28 NOTE — Progress Notes (Signed)
09:31:25 8 beat run of V-Tach (heart strip is in Epic for review).  Dr. Algie Coffer has been notified.  VSS.  Pt in no acute distress.

## 2015-04-28 NOTE — Progress Notes (Signed)
After speaking with son, Angela Stuart, he requests that Dr. Algie Coffer call him at [phone], to discuss possible cardiac cath procedures for pt.  Message has been given to Dr. Algie Coffer to call son.

## 2015-04-28 NOTE — Progress Notes (Addendum)
[facility]                                                                              Patient Demographics  Angela Stuart, is a 82 y.o. female, DOB - 1931-07-15, ZOX:[id number]  Admit date - 04/21/2015   Admitting Physician Hillary Bow, DO  Outpatient Primary MD for the patient is GREEN, Lenon Curt, MD  LOS - 7   Chief Complaint  Patient presents with  . Leg Pain      HPI on 04/21/2015 by Dr. Lyda Perone Angela Stuart is a 82 y.o. female with h/o CHF, CABG, DM, HTN. Patient presents to the ED with several week history of worsening peripheral edema and generalized weakness and fatigue. This became severe enough today that she could not walk per family. She recently moved to area from [place]. Symptoms are not being helped by her home Lasix which she takes once a day at noon (dose unknown). She denies any chest pain, no change in urination.  Interim history Patient found to have high risk stress test.  Pending further recommendations from cardiology regarding cath.  Assessment & Plan   Acute Combined systolic/diastolic CHF exacerbation -CXR: Enlargement of cardiopericardial silhouette, cardiomegaly versus pericardial effusion. Vascular congestion without overt edema -Echocardiogram: EF 35-40%, diastolic dysfunction, Severe MR posteriorly  -BNP >4500 -Continue lasix, increased by cardiology  IV BID -Monitor daily weights, intake/output -Cardiology consulted and appreciated -No ACEI given AKI vs CKD -UOP 500 cc over past 24 hours -Lexiscan- EF21%, low anterior wall ishcemia, high risk stress findings -Cardiology recommended Cath, patient wants to discuss this with her family  Venous insuff. Lower extremity vs cellulitis -RLE appears to have erythema, patient currently afebrile, no leukocytosis. States it has been red for quite some time -Unlikely cellulitis, continue to monitor- erythema not improving,  Placed on doxycycline for possible cellulitis-  however no improvement.  Discontinued. -LE Doppler: negative for DVT  Elevated troponin -Likely secondary to demand ischemia vs CHF exac -Remaining fairly flat.  Acute kidney injury vs CKD -No previous Creatinine in the system -metformin held -Pending records -Cr 1.7 today, likely due to diuresis vs CKD -Continue to monitor BMP -Renal US: normal renal sizes and parenchymal echogenicity, no hydronephrosis.  Ascites, liver appearance raises ?cirrhosis   Diabetes mellitus, type 2 -Continue home lantus, ISS, and CBG monitoring -Metformin held -Hemoglobin A1c 8.2 -Diabetes coordinator consulted  Hypokalemia -Likely secondary to diuresis -Replace and continue to monitor BMP  Depression  -Continue Celexa  Hyperlipidemia -Continue statin  Constipation -Patient was able to have BM   ?Cirrhosis  -Found on renal US -LFTs nornmal  Abnormal TSH -TSH 5.028, FT4 1.59 -Patient should have repeat thyroid test after acute illness has resolved  Physical deconditioning/ Right hip pain -PT consulted and pending -As patient was working with PT, she began to have hip pain when attempting to get on stairs.  Session discontinued. -Hip xrays ordered  Nausea  -Resolved, Possibly secondary to lexiscan scan -Continue antiemetics   Code Status: Full  Family Communication: None at bedside.    Disposition Plan: Admitted. Pending further recommendations from cardiology.  Time Spent in minutes   30 minutes  Procedures  Echocardiogram [facility]  Consults   Cardiology  DVT Prophylaxis  heparin  Lab Results  Component Value Date   PLT 172 04/28/2015    Medications  Scheduled Meds: . aspirin EC  81 mg Oral Daily  . citalopram  10 mg Oral Daily  . enoxaparin (LOVENOX) injection  40 mg Subcutaneous Q24H  . ferrous sulfate  325 mg Oral BID  . furosemide  80 mg Intravenous BID  . hydrocortisone   Rectal TID  . insulin aspart  0-15 Units Subcutaneous TID WC  . insulin glargine   12 Units Subcutaneous q morning - 10a  . rosuvastatin  5 mg Oral QPM  . sodium chloride  3 mL Intravenous Q12H   Continuous Infusions:  PRN Meds:.sodium chloride, acetaminophen, ondansetron (ZOFRAN) IV, polyvinyl alcohol, promethazine, sodium chloride  Antibiotics    Anti-infectives    Start     Dose/Rate Route Frequency Ordered Stop   04/24/15 1045  doxycycline (VIBRA-TABS) tablet 100 mg  Status:  Discontinued     100 mg Oral Every 12 hours 04/24/15 1031 04/28/15 0903      Subjective:   Angela Stuart seen and examined today.  Patient denies chest chest pain, abdominal pain, nausea, vomiting, diarrhea.  Feels her breathing has improved.  Still has leg swelling.  Objective:   Filed Vitals:   04/27/15 1123 04/27/15 2131 04/28/15 0548 04/28/15 0936  BP: 99/61 122/63 123/73 127/70  Pulse: 102 84 90 95  Temp:  97.5 F (36.4 C) 97.6 F (36.4 C) 97.7 F (36.5 C)  TempSrc:  Oral Oral Oral  Resp: Height:      Weight:   72.7 kg (160 lb 4.4 oz)   SpO2: 100% 99% 97% 99%    Wt Readings from Last 3 Encounters:  04/28/15 72.7 kg (160 lb 4.4 oz)     Intake/Output Summary (Last 24 hours) at 04/28/15 1136 Last data filed at 04/28/15 1116  Gross per 24 hour  Intake    420 ml  Output    507 ml  Net    -87 ml    Exam  General: Well developed, well nourished, NAD  HEENT: NCAT, mucous membranes moist.   Cardiovascular: S1 S2 auscultated, RRR, soft SEM  Respiratory: Clear to auscultation   Abdomen: Soft, nontender, nondistended, + bowel sounds  Extremities: warm dry without cyanosis clubbing. 2+ edema in LE B/L. Erythema RLE-unchanged  Neuro: AAOx3, nonfocal  Psych: Normal affect and demeanor  Data Review   Micro Results Recent Results (from the past 240 hour(s))  MRSA PCR Screening     Status: None   Collection Time: 04/21/15 10:55 PM  Result Value Ref Range Status   MRSA by PCR NEGATIVE NEGATIVE Final    Comment:        The GeneXpert MRSA Assay  (FDA approved for NASAL specimens only), is one component of a comprehensive MRSA colonization surveillance program. It is not intended to diagnose MRSA infection nor to guide or monitor treatment for MRSA infections.     Radiology Reports Dg Chest 2 View  04/21/2015  CLINICAL DATA:  Lower extremity swelling and edema for 2 months. EXAM: CHEST  2 VIEW COMPARISON:  None. FINDINGS: Low lung volumes. The lungs are clear wiithout focal pneumonia, edema, pneumothorax or pleural effusion. Interstitial markings are diffusely coarsened with chronic features. There is pulmonary vascular congestion without overt pulmonary edema. The cardio pericardial silhouette is enlarged. Patient is status post median sternotomy Bones are diffusely demineralized. IMPRESSION: Enlargement of the  cardiopericardial silhouette. Cardiomegaly versus pericardial effusion. Vascular congestion without overt edema. Electronically Signed   By: Kennith Center M.D.   On: 04/21/2015 19:28   US Renal  04/24/2015  CLINICAL DATA:  Chronic kidney disease. EXAM: RENAL / URINARY TRACT ULTRASOUND COMPLETE COMPARISON:  None. FINDINGS: Right Kidney: Length: 10.0 cm. Normal parenchymal echogenicity. 2.7 cm cortical cyst arises from the upper pole. No other renal masses, no stones and no hydronephrosis. Left Kidney: Length: 10.2 cm. Echogenicity within normal limits. No mass or hydronephrosis visualized. Bladder: Appears normal for degree of bladder distention. Note is made of small to moderate amount of ascites. Liver appears nodular with a coarsened echotexture suggesting cirrhosis. IMPRESSION: 1. Normal renal sizes and parenchymal echogenicity. No hydronephrosis. Right renal cyst. No other masses. 2. Ascites.  Liver appearance raises suspicion for cirrhosis. Electronically Signed   By: Amie Portland M.D.   On: 04/24/2015 11:56   Nm Myocar Multi W/spect W/wall Motion / Ef  04/27/2015  CLINICAL DATA:  History of Coronary artery disease, bypass  surgery, myocardial infarctions, diabetes, hypertension and CHF. EXAM: MYOCARDIAL IMAGING WITH SPECT (REST AND PHARMACOLOGIC-STRESS) GATED LEFT VENTRICULAR WALL MOTION STUDY LEFT VENTRICULAR EJECTION FRACTION TECHNIQUE: Standard myocardial SPECT imaging was performed after resting intravenous injection of 10 mCi Tc-55m sestamibi. Subsequently, intravenous infusion of Lexiscan was performed under the supervision of the Cardiology staff. At peak effect of the drug, 30 mCi Tc-63m sestamibi was injected intravenously and standard myocardial SPECT imaging was performed. Quantitative gated imaging was also performed to evaluate left ventricular wall motion, and estimate left ventricular ejection fraction. COMPARISON:  None. FINDINGS: Perfusion: Small reversible defect involving the low anterior wall consistent with ischemia. Wall Motion: Left ventricular dilatation and global hypokinesis. Left Ventricular Ejection Fraction: 21 % End diastolic volume 185 ml End systolic volume 145 ml IMPRESSION: 1. Low anterior wall ischemia. 2. Global hypokinesis. 3. Left ventricular ejection fraction 21% 4. High-risk stress test findings*. *2012 Appropriate Use Criteria for Coronary Revascularization Focused Update: J Am Coll Cardiol. 2012;59(9):857-881. http://content.dementiazones.com.aspx?articleid=[email or web address] Electronically Signed   By: Rudie Meyer M.D.   On: 04/27/2015 11:59    CBC  Recent Labs Lab 04/21/15 1938 04/23/15 0451 04/25/15 0230 04/26/15 0337 04/27/15 0530 04/28/15 0356  WBC 7.7 7.1 7.4 7.1 6.7 9.0  HGB 12.9 12.6 12.6 12.0 12.3 13.3  HCT 40.9 40.2 39.6 38.4 39.1 41.8  PLT 172 150 151 146* 137* 172  MCV 89.5 89.9 90.2 88.7 88.7 89.1  MCH 28.2 28.2 28.7 27.7 27.9 28.4  MCHC 31.5 31.3 31.8 31.3 31.5 31.8  RDW 14.8 14.9 15.1 14.9 15.1 15.4  LYMPHSABS 1.5  --   --   --   --   --   MONOABS 0.5  --   --   --   --   --   EOSABS 0.2  --   --   --   --   --   BASOSABS 0.0  --   --   --   --   --      Chemistries   Recent Labs Lab 04/23/15 1143 04/24/15 0319 04/24/15 1151 04/25/15 0230 04/26/15 0337 04/27/15 0530 04/28/15 0356  NA  --  139  --  136 138 137 138  K  --  3.8  --  3.6 3.3* 3.7 4.0  CL  --  105  --  105 106 105 106  CO2  --  22  --  22 25 23 22   GLUCOSE  --  153*  --  188* 150* 117* 100*  BUN  --  50*  --  47* 45* 43* 51*  CREATININE  --  1.59*  --  1.58* 1.50* 1.37* 1.70*  CALCIUM  --  9.1  --  8.9 8.8* 9.0 9.6  MG 1.8  --   --   --   --  1.5*  --   AST  --   --  27  --   --   --   --   ALT  --   --  13*  --   --   --   --   ALKPHOS  --   --  70  --   --   --   --   BILITOT  --   --  1.2  --   --   --   --    ------------------------------------------------------------------------------------------------------------------ estimated creatinine clearance is 26.5 mL/min (by C-G formula based on Cr of 1.7). ------------------------------------------------------------------------------------------------------------------ No results for input(s): HGBA1C in the last 72 hours. ------------------------------------------------------------------------------------------------------------------ No results for input(s): CHOL, HDL, LDLCALC, TRIG, CHOLHDL, LDLDIRECT in the last 72 hours. ------------------------------------------------------------------------------------------------------------------ No results for input(s): TSH, T4TOTAL, T3FREE, THYROIDAB in the last 72 hours.  Invalid input(s): FREET3 ------------------------------------------------------------------------------------------------------------------ No results for input(s): VITAMINB12, FOLATE, FERRITIN, TIBC, IRON, RETICCTPCT in the last 72 hours.  Coagulation profile No results for input(s): INR, PROTIME in the last 168 hours.  No results for input(s): DDIMER in the last 72 hours.  Cardiac Enzymes  Recent Labs Lab 04/21/15 2334 04/22/15 0323 04/22/15 0935  TROPONINI 0.16* 0.15* 0.13*    ------------------------------------------------------------------------------------------------------------------ Invalid input(s): POCBNP    Angela Stuart D.O. on 04/28/2015 at 11:36 AM  Between 7am to 7pm - Pager - [phone]  After 7pm go to [email or web address] - password [email or web address]  And look for the night coverage person covering for me after hours  [facility] Office  [phone]

## 2015-04-28 NOTE — Progress Notes (Signed)
Physical Therapy Treatment Patient Details Name: Angela Stuart MRN: [id number] DOB: April 20, 1931 Today's Date: 04/28/2015    History of Present Illness 82 yo female with onset of CHF and renal insufficiency is having elevated troponin from demand ischemia, LE edema with RLE redness, pericardial effusion.  PMHx:  CABG, CHF    PT Comments    Patient seen for therapy progression and attempts to perform stair negotiation. Discussed with patient at length concerns regarding home set up (per patient place is to return to sons home). This therapist is concerned with this plan of care as patient is high fall risk and severe deconditioning. If patient does return home, will need 24/7 assist/supervision and HHPT, otherwise, highly recommend ST SNF upon acute discharge.  Worked with patient this am. Attempted stair negotiation with gait belt and rail during therapy session. Patient elevated RLE up on to first step and attempted to ascend, during attempt, patient was unable to carry out elevation and her LEs buckled leaving patient in semi squat position with inability to power up. At that time, this therapist slowly lowered patient to the ground using gait belt and posterior positioning. Upon reaching the ground assisted patient to side-lying position and called for assist to help patient off the floor. 3 person assist utilized to elevate patient to standing and then assisted to chair. Upon return to room, I assessed patient for injury, patient did complain of some right hip discomfort. Notified nursing and physician immediately. Patient was able to stand using RW and ambulate to and from bedside    Follow Up Recommendations  Home health PT;Supervision/Assistance - 24 hour (recommend SNF however, patient desires to go to sons home)     Equipment Recommendations  Rolling walker with 5" wheels    Recommendations for Other Services       Precautions / Restrictions Precautions Precautions: Fall  (telemetry) Restrictions Weight Bearing Restrictions: No    Mobility  Bed Mobility Overal bed mobility: Modified Independent             General bed mobility comments: significantly increased time to perform with use of bed rail  Transfers Overall transfer level: Needs assistance Equipment used: 1 person hand held assist;Rolling walker (2 wheeled) Transfers: Sit to/from [employer] Sit to Stand: Min guard;Min assist Stand pivot transfers: Min guard;Min assist       General transfer comment: VCs for hand placement, use of RW, performed from bed, chair and armless chair with modified hand position  Ambulation/Gait Ambulation/Gait assistance: Min guard Ambulation Distance (Feet): 40 Feet Assistive device: Rolling walker (2 wheeled);1 person hand held assist Gait Pattern/deviations: Step-through pattern;Decreased stride length;Shuffle;Trunk flexed Gait velocity: decreased Gait velocity interpretation: Below normal speed for age/gender     Stairs Stairs:  (attempted but unable to perform safely: see clinical notes)          Wheelchair Mobility    Modified Rankin (Stroke Patients Only)       Balance Overall balance assessment: Needs assistance;History of Falls Sitting-balance support: Feet supported Sitting balance-Leahy Scale: Good Sitting balance - Comments: tolerated increased time EOB and on bedside commode Postural control: Posterior lean Standing balance support: Bilateral upper extremity supported Standing balance-Leahy Scale: Fair (poor to fair) Standing balance comment: VCs for posture and positioning, required use of RW for stability in standing, patient able to perform self care and hygiene in standing with use of RW for support  Cognition Arousal/Alertness: Awake/alert Behavior During Therapy: WFL for tasks assessed/performed Overall Cognitive Status: Within Functional Limits for tasks assessed                       Exercises      General Comments General comments (skin integrity, edema, etc.): patient remains significant fall risk, unable to perform stair negotiation (see clinical notes)       Pertinent Vitals/Pain Pain Assessment: Faces Faces Pain Scale: Hurts little more Pain Location: right hip after controlled fall during activity (MD Aware) Pain Descriptors / Indicators: Discomfort Pain Intervention(s): Monitored during session;Limited activity within patient's tolerance;Repositioned;Relaxation (notified MD)    Home Living                      Prior Function            PT Goals (current goals can now be found in the care plan section) Acute Rehab PT Goals Patient Stated Goal: to walk safely and get home PT Goal Formulation: With patient Time For Goal Achievement: 05/08/15 Potential to Achieve Goals: Good Progress towards PT goals: Not progressing toward goals - comment    Frequency  Min 2X/week    PT Plan Other (comment) (discharge plan is concerning)    Co-evaluation             End of Session Equipment Utilized During Treatment: Gait belt Activity Tolerance: Patient tolerated treatment well Patient left: in chair;with call bell/phone within reach;with chair alarm set     Time: 1041-1107 PT Time Calculation (min) (ACUTE ONLY): 26 min  Charges:  $Gait Training: 8-22 mins $Therapeutic Activity: 8-22 mins                    G CodesFabio Asa 05/09/2015, 12:12 PM Charlotte Crumb, PT DPT  [phone]

## 2015-04-28 NOTE — Progress Notes (Addendum)
Son, Tonita Cong, attempted return phone call to him at [phone], left VM.

## 2015-04-28 NOTE — Progress Notes (Signed)
I was contacted by patient's daughter-in-law in reference to the "plan" for her mother- in-law.  She tells me that the patient had not mentioned having a cardiac cath to she or her husband and that the patient is frequently quite confused. She and her husband would like to speak with Dr. Algie Coffer.  They are also concerned about the patients plan at discharge and whether or not she can be discharged to independent living.  I have made both the floor nurse and Dr. Algie Coffer aware that they would like to speak with him.

## 2015-04-28 NOTE — Progress Notes (Signed)
Utilization review complete. Jasun Gasparini RN CCM Case Mgmt phone [phone] 

## 2015-04-29 ENCOUNTER — Inpatient Hospital Stay (HOSPITAL_COMMUNITY): Payer: Medicare Other

## 2015-04-29 DIAGNOSIS — N183 Chronic kidney disease, stage 3 (moderate): Secondary | ICD-10-CM

## 2015-04-29 DIAGNOSIS — I5023 Acute on chronic systolic (congestive) heart failure: Secondary | ICD-10-CM

## 2015-04-29 DIAGNOSIS — N179 Acute kidney failure, unspecified: Secondary | ICD-10-CM

## 2015-04-29 DIAGNOSIS — I2510 Atherosclerotic heart disease of native coronary artery without angina pectoris: Secondary | ICD-10-CM

## 2015-04-29 DIAGNOSIS — K5909 Other constipation: Secondary | ICD-10-CM

## 2015-04-29 LAB — GLUCOSE, CAPILLARY
GLUCOSE-CAPILLARY: 151 mg/dL — AB (ref 65–99)
GLUCOSE-CAPILLARY: 119 mg/dL — AB (ref 65–99)
Glucose-Capillary: 101 mg/dL — ABNORMAL HIGH (ref 65–99)
Glucose-Capillary: 122 mg/dL — ABNORMAL HIGH (ref 65–99)

## 2015-04-29 LAB — CBC
HCT: 40.5 % (ref 36.0–46.0)
Hemoglobin: 13.1 g/dL (ref 12.0–15.0)
MCH: 28.3 pg (ref 26.0–34.0)
MCHC: 32.3 g/dL (ref 30.0–36.0)
MCV: 87.5 fL (ref 78.0–100.0)
PLATELETS: 155 10*3/uL (ref 150–400)
RBC: 4.63 MIL/uL (ref 3.87–5.11)
RDW: 15.4 % (ref 11.5–15.5)
WBC: 7.3 10*3/uL (ref 4.0–10.5)

## 2015-04-29 LAB — CARBOXYHEMOGLOBIN
CARBOXYHEMOGLOBIN: 1.5 % (ref 0.5–1.5)
Methemoglobin: 0.5 % (ref 0.0–1.5)
O2 SAT: 50 %
Total hemoglobin: 12.3 g/dL (ref 12.0–16.0)

## 2015-04-29 LAB — BASIC METABOLIC PANEL
Anion gap: 10 (ref 5–15)
BUN: 52 mg/dL — AB (ref 6–20)
CO2: 24 mmol/L (ref 22–32)
CREATININE: 1.74 mg/dL — AB (ref 0.44–1.00)
Calcium: 9.4 mg/dL (ref 8.9–10.3)
Chloride: 106 mmol/L (ref 101–111)
GFR, EST AFRICAN AMERICAN: 30 mL/min — AB (ref >60)
GFR, EST NON AFRICAN AMERICAN: 26 mL/min — AB (ref >60)
Glucose, Bld: 113 mg/dL — ABNORMAL HIGH (ref 65–99)
Potassium: 4.2 mmol/L (ref 3.5–5.1)
SODIUM: 140 mmol/L (ref 135–145)

## 2015-04-29 LAB — BRAIN NATRIURETIC PEPTIDE

## 2015-04-29 MED ORDER — MILRINONE IN DEXTROSE 20 MG/100ML IV SOLN
0.2500 ug/kg/min | INTRAVENOUS | Status: DC
  Administered 2015-04-29 – 2015-05-01 (×4): 0.25 ug/kg/min via INTRAVENOUS
  Administered 2015-05-02 – 2015-05-05 (×6): 0.375 ug/kg/min via INTRAVENOUS
  Administered 2015-05-05: 0.25 ug/kg/min via INTRAVENOUS
  Filled 2015-04-29 (×11): qty 100

## 2015-04-29 MED ORDER — OXYCODONE-ACETAMINOPHEN 5-325 MG PO TABS
1.0000 | ORAL_TABLET | Freq: Three times a day (TID) | ORAL | Status: DC | PRN
  Administered 2015-04-29 – 2015-05-02 (×4): 1 via ORAL
  Filled 2015-04-29 (×4): qty 1

## 2015-04-29 MED ORDER — SODIUM CHLORIDE 0.9 % IJ SOLN
10.0000 mL | INTRAMUSCULAR | Status: DC | PRN
  Administered 2015-04-29 – 2015-05-06 (×5): 10 mL
  Filled 2015-04-29 (×5): qty 40

## 2015-04-29 MED ORDER — FUROSEMIDE 10 MG/ML IJ SOLN
80.0000 mg | Freq: Two times a day (BID) | INTRAMUSCULAR | Status: DC
  Administered 2015-04-29 – 2015-05-01 (×4): 80 mg via INTRAVENOUS
  Filled 2015-04-29 (×4): qty 8

## 2015-04-29 MED ORDER — MILRINONE IN DEXTROSE 20 MG/100ML IV SOLN: 0.2500 ug/kg/min | INTRAVENOUS | Status: DC

## 2015-04-29 MED ORDER — FUROSEMIDE 10 MG/ML IJ SOLN: 80.0000 mg | Freq: Two times a day (BID) | INTRAMUSCULAR | Status: DC

## 2015-04-29 MED ORDER — CLONAZEPAM 0.5 MG PO TABS
0.2500 mg | ORAL_TABLET | Freq: Three times a day (TID) | ORAL | Status: DC | PRN
  Administered 2015-04-29 – 2015-05-05 (×2): 0.25 mg via ORAL
  Filled 2015-04-29 (×2): qty 1

## 2015-04-29 MED ORDER — LISINOPRIL 2.5 MG PO TABS
2.5000 mg | ORAL_TABLET | Freq: Every day | ORAL | Status: DC
  Filled 2015-04-29: qty 1

## 2015-04-29 NOTE — Progress Notes (Signed)
Pt stated to Dr. Gala Romney and I that she has a living will in place and is a DNR.    Casimiro Needle [address] [place], PA-C 04/29/2015 12:07 PM

## 2015-04-29 NOTE — Progress Notes (Signed)
Peripherally Inserted Central Catheter/Midline Placement  The IV Nurse has discussed with the patient and/or persons authorized to consent for the patient, the purpose of this procedure and the potential benefits and risks involved with this procedure.  The benefits include less needle sticks, lab draws from the catheter and patient may be discharged home with the catheter.  Risks include, but not limited to, infection, bleeding, blood clot (thrombus formation), and puncture of an artery; nerve damage and irregular heat beat.  Alternatives to this procedure were also discussed.  PICC/Midline Placement Documentation  PICC Double Lumen 04/29/15 PICC Right Cephalic 38 cm 2 cm (Active)  Indication for Insertion or Continuance of Line Vasoactive infusions 04/29/2015  2:00 PM  Exposed Catheter (cm) 2 cm 04/29/2015  2:00 PM  Dressing Change Due 05/06/15 04/29/2015  2:00 PM       Angela Stuart 04/29/2015, 2:11 PM

## 2015-04-29 NOTE — Research (Signed)
ReDS Vest Discharge Study  Results of ReDS reading is 43 which is above the threshold for discharge.  Your patient has been referred to the Advanced Heart Failure Team for further evaluation.   Thank You The research team

## 2015-04-29 NOTE — Progress Notes (Signed)
Palliative Medicine Team consult was received.  I stopped and briefly met with Angela Stuart this afternoon.  She reports that she would like for me to return when her son, Angela Stuart, can be present for meeting.  I have called and scheduled a family meeting for tomorrow at 4:30pm.   If there are urgent needs or questions please call [phone]. Thank you for consulting out team to assist with this patients care.  Micheline Rough, MD [place] Team [phone]

## 2015-04-29 NOTE — Care Management Note (Signed)
Case Management Note  Patient Details  Name: Angela Stuart MRN: [id number] Date of Birth: 01-23-1932  Subjective/Objective:      CHF              Action/Plan:  NCM spoke to pt and gave permission to contact son, Angela Stuart # [phone] for additional information. Pt states she lives in [place] apt at [facility]. She prefers to go to SNF-rehab. CSW referral for SNF placement.    Expected Discharge Date:  04/30/2015               Expected Discharge Plan:  Skilled Nursing Facility  In-House Referral:  Clinical Social Work  Discharge planning Services  CM Consult  Post Acute Care Choice:  NA Choice offered to:  NA  DME Arranged:  N/A DME Agency:  NA  HH Arranged:  NA HH Agency:  NA  Status of Service:  Completed, signed off  Medicare Important Message Given:  Yes Date Medicare IM Given:    Medicare IM give by:    Date Additional Medicare IM Given:    Additional Medicare Important Message give by:     If discussed at Long Length of Stay Meetings, dates discussed:    Additional Comments:  Elliot Cousin, RN 04/29/2015, 10:18 AM

## 2015-04-29 NOTE — Progress Notes (Addendum)
[facility]                                                                              Patient Demographics  Angela Stuart, is a 82 y.o. female, DOB - 13-Aug-1931, MVH:[id number]  Admit date - 04/21/2015   Admitting Physician Hillary Bow, DO  Outpatient Primary MD for the patient is GREEN, Lenon Curt, MD  LOS - 8   Chief Complaint  Patient presents with  . Leg Pain      HPI on 04/21/2015 by Dr. Lyda Perone Angela Stuart is a 82 y.o. female with h/o CHF, CABG, DM, HTN. Patient presents to the ED with several week history of worsening peripheral edema and generalized weakness and fatigue. This became severe enough today that she could not walk per family. She recently moved to area from [place]. Symptoms are not being helped by her home Lasix which she takes once a day at noon (dose unknown). She denies any chest pain, no change in urination.  Interim history Patient found to have high risk stress test.  Pending further recommendations from cardiology regarding cath.  Assessment & Plan   Acute Combined systolic/diastolic CHF exacerbation -CXR: Enlargement of cardiopericardial silhouette, cardiomegaly versus pericardial effusion. Vascular congestion without overt edema -Echocardiogram: EF 35-40%, diastolic dysfunction, Severe MR posteriorly  -BNP >4500 -Continue lasix, increased by cardiology  IV BID -Monitor daily weights, intake/output -Cardiology consulted and appreciated -No ACEI given AKI vs CKD -UOP 500 cc over past 24 hours -Lexiscan- EF21%, low anterior wall ishcemia, high risk stress findings -Cardiology recommended Cath, patient and family declined cardiac cath due to tendency to bleed easily -1/19 heart failure team consulted  Venous insuff. Lower extremity vs cellulitis -RLE appears to have erythema, patient currently afebrile, no leukocytosis. States it has been red for quite some time -Unlikely cellulitis, continue to monitor- erythema not  improving,  Placed on doxycycline for possible cellulitis- however no improvement.  Discontinued. -LE Doppler: negative for DVT  Elevated troponin -Likely secondary to demand ischemia vs CHF exac -Remaining fairly flat.  Acute kidney injury vs CKD -No previous Creatinine in the system -metformin held -Pending records -Cr 1.7 today, likely due to diuresis vs CKD -Continue to monitor BMP -Renal US: normal renal sizes and parenchymal echogenicity, no hydronephrosis.  Ascites, liver appearance raises ?cirrhosis   Diabetes mellitus, type 2 -Continue home lantus, ISS, and CBG monitoring -Metformin held -Hemoglobin A1c 8.2 -Diabetes coordinator consulted  Hypokalemia -Likely secondary to diuresis -Replace and continue to monitor BMP  Depression  -Continue Celexa  Hyperlipidemia -Continue statin  Constipation -Patient was able to have BM   ?Cirrhosis  -Found on renal US -LFTs nornmal  Abnormal TSH -TSH 5.028, FT4 1.59 -Patient should have repeat thyroid test after acute illness has resolved  Physical deconditioning/ Right hip pain -PT consulted and pending -As patient was working with PT, she began to have hip pain when attempting to get on stairs.  Session discontinued. -Hip xrays , no fracture  Nausea  -Resolved, Possibly secondary to lexiscan scan -Continue antiemetics   Code Status: Full  Family Communication: None at bedside.    Disposition Plan: Admitted. Pending further recommendations from cardiology and heart failure  team  Time Spent in minutes   30 minutes  Procedures  Echocardiogram Lexiscan  Consults   Cardiology Heart failure team  DVT Prophylaxis  heparin  Lab Results  Component Value Date   PLT 155 04/29/2015    Medications  Scheduled Meds: . aspirin EC  81 mg Oral Daily  . citalopram  10 mg Oral Daily  . enoxaparin (LOVENOX) injection  30 mg Subcutaneous Q24H  . ferrous sulfate  325 mg Oral BID  . furosemide  40 mg Oral BID  .  hydrocortisone   Rectal TID  . insulin aspart  0-15 Units Subcutaneous TID WC  . insulin glargine  12 Units Subcutaneous q morning - 10a  . rosuvastatin  5 mg Oral QPM   Continuous Infusions:  PRN Meds:.sodium chloride, acetaminophen, ondansetron (ZOFRAN) IV, polyvinyl alcohol, promethazine, sodium chloride  Antibiotics    Anti-infectives    Start     Dose/Rate Route Frequency Ordered Stop   04/24/15 1045  doxycycline (VIBRA-TABS) tablet 100 mg  Status:  Discontinued     100 mg Oral Every 12 hours 04/24/15 1031 04/28/15 0903      Subjective:   Angela Stuart seen and examined today.  Reported fell yesterday during working with physical therapy, some mild back pain, reported persistent lower extremity edema,  Continue to be constipated, Patient denies chest chest pain, abdominal pain, nausea, vomiting, diarrhea.    Objective:   Filed Vitals:   04/28/15 1720 04/28/15 1946 04/29/15 0338 04/29/15 0422  BP: 127/60 110/69  114/73  Pulse: 90 87  85  Temp:  98.2 F (36.8 C)  98.2 F (36.8 C)  TempSrc:  Oral  Oral  Resp:  18  18  Height:      Weight:   73.573 kg (162 lb 3.2 oz)   SpO2:  100%  100%    Wt Readings from Last 3 Encounters:  04/29/15 73.573 kg (162 lb 3.2 oz)     Intake/Output Summary (Last 24 hours) at 04/29/15 1109 Last data filed at 04/29/15 0954  Gross per 24 hour  Intake    840 ml  Output    900 ml  Net    -60 ml    Exam  General: Well developed, well nourished, NAD  HEENT: NCAT, mucous membranes moist.   Cardiovascular: S1 S2 auscultated, RRR, soft SEM  Respiratory: Clear to auscultation   Abdomen: Soft, nontender, nondistended, + bowel sounds  Extremities: warm dry without cyanosis clubbing. 2+ edema in LE B/L. Erythema RLE-unchanged  Neuro: AAOx3, nonfocal  Psych: Normal affect and demeanor  Data Review   Micro Results Recent Results (from the past 240 hour(s))  MRSA PCR Screening     Status: None   Collection Time: 04/21/15 10:55 PM    Result Value Ref Range Status   MRSA by PCR NEGATIVE NEGATIVE Final    Comment:        The GeneXpert MRSA Assay (FDA approved for NASAL specimens only), is one component of a comprehensive MRSA colonization surveillance program. It is not intended to diagnose MRSA infection nor to guide or monitor treatment for MRSA infections.     Radiology Reports Dg Chest 2 View  04/21/2015  CLINICAL DATA:  Lower extremity swelling and edema for 2 months. EXAM: CHEST  2 VIEW COMPARISON:  None. FINDINGS: Low lung volumes. The lungs are clear wiithout focal pneumonia, edema, pneumothorax or pleural effusion. Interstitial markings are diffusely coarsened with chronic features. There is pulmonary vascular congestion without overt pulmonary  edema. The cardio pericardial silhouette is enlarged. Patient is status post median sternotomy Bones are diffusely demineralized. IMPRESSION: Enlargement of the cardiopericardial silhouette. Cardiomegaly versus pericardial effusion. Vascular congestion without overt edema. Electronically Signed   By: Kennith Center M.D.   On: 04/21/2015 19:28   US Renal  04/24/2015  CLINICAL DATA:  Chronic kidney disease. EXAM: RENAL / URINARY TRACT ULTRASOUND COMPLETE COMPARISON:  None. FINDINGS: Right Kidney: Length: 10.0 cm. Normal parenchymal echogenicity. 2.7 cm cortical cyst arises from the upper pole. No other renal masses, no stones and no hydronephrosis. Left Kidney: Length: 10.2 cm. Echogenicity within normal limits. No mass or hydronephrosis visualized. Bladder: Appears normal for degree of bladder distention. Note is made of small to moderate amount of ascites. Liver appears nodular with a coarsened echotexture suggesting cirrhosis. IMPRESSION: 1. Normal renal sizes and parenchymal echogenicity. No hydronephrosis. Right renal cyst. No other masses. 2. Ascites.  Liver appearance raises suspicion for cirrhosis. Electronically Signed   By: Amie Portland M.D.   On: 04/24/2015 11:56    Nm Myocar Multi W/spect W/wall Motion / Ef  04/27/2015  CLINICAL DATA:  History of Coronary artery disease, bypass surgery, myocardial infarctions, diabetes, hypertension and CHF. EXAM: MYOCARDIAL IMAGING WITH SPECT (REST AND PHARMACOLOGIC-STRESS) GATED LEFT VENTRICULAR WALL MOTION STUDY LEFT VENTRICULAR EJECTION FRACTION TECHNIQUE: Standard myocardial SPECT imaging was performed after resting intravenous injection of 10 mCi Tc-36m sestamibi. Subsequently, intravenous infusion of Lexiscan was performed under the supervision of the Cardiology staff. At peak effect of the drug, 30 mCi Tc-58m sestamibi was injected intravenously and standard myocardial SPECT imaging was performed. Quantitative gated imaging was also performed to evaluate left ventricular wall motion, and estimate left ventricular ejection fraction. COMPARISON:  None. FINDINGS: Perfusion: Small reversible defect involving the low anterior wall consistent with ischemia. Wall Motion: Left ventricular dilatation and global hypokinesis. Left Ventricular Ejection Fraction: 21 % End diastolic volume 185 ml End systolic volume 145 ml IMPRESSION: 1. Low anterior wall ischemia. 2. Global hypokinesis. 3. Left ventricular ejection fraction 21% 4. High-risk stress test findings*. *2012 Appropriate Use Criteria for Coronary Revascularization Focused Update: J Am Coll Cardiol. 2012;59(9):857-881. http://content.dementiazones.com.aspx?articleid=[email or web address] Electronically Signed   By: Rudie Meyer M.D.   On: 04/27/2015 11:59   Dg Hip Unilat With Pelvis 2-3 Views Right  04/28/2015  CLINICAL DATA:  82 year old female who fell on steps this morning. Posterior right hip pain. Initial encounter. EXAM: DG HIP (WITH OR WITHOUT PELVIS) 2-3V RIGHT COMPARISON:  None. FINDINGS: Osteopenia. Extensive aortic, iliac, and femoral artery calcified atherosclerosis. Femoral heads are normally located. No pelvic fracture identified. Proximal left femur appears grossly intact.  Proximal right femur appears intact. SI joints within normal limits. IMPRESSION: No acute fracture or dislocation identified about the right hip per pelvis. If occult hip fracture is suspected or if the patient is unable to weightbear, MRI is the preferred modality for further evaluation. Electronically Signed   By: Odessa Fleming M.D.   On: 04/28/2015 12:14    CBC  Recent Labs Lab 04/25/15 0230 04/26/15 0337 04/27/15 0530 04/28/15 0356 04/29/15 0320  WBC 7.4 7.1 6.7 9.0 7.3  HGB 12.6 12.0 12.3 13.3 13.1  HCT 39.6 38.4 39.1 41.8 40.5  PLT 151 146* 137* 172 155  MCV 90.2 88.7 88.7 89.1 87.5  MCH 28.7 27.7 27.9 28.4 28.3  MCHC 31.8 31.3 31.5 31.8 32.3  RDW 15.1 14.9 15.1 15.4 15.4    Chemistries   Recent Labs Lab 04/23/15 1143  04/24/15 1151 04/25/15 0230 04/26/15 1610  04/27/15 0530 04/28/15 0356 04/29/15 0320  NA  --   < >  --  136 138 137 138 140  K  --   < >  --  3.6 3.3* 3.7 4.0 4.2  CL  --   < >  --  105 106 105 106 106  CO2  --   < >  --  22 25 23 22 24   GLUCOSE  --   < >  --  188* 150* 117* 100* 113*  BUN  --   < >  --  47* 45* 43* 51* 52*  CREATININE  --   < >  --  1.58* 1.50* 1.37* 1.70* 1.74*  CALCIUM  --   < >  --  8.9 8.8* 9.0 9.6 9.4  MG 1.8  --   --   --   --  1.5*  --   --   AST  --   --  27  --   --   --   --   --   ALT  --   --  13*  --   --   --   --   --   ALKPHOS  --   --  70  --   --   --   --   --   BILITOT  --   --  1.2  --   --   --   --   --   < > = values in this interval not displayed. ------------------------------------------------------------------------------------------------------------------ estimated creatinine clearance is 26 mL/min (by C-G formula based on Cr of 1.74). ------------------------------------------------------------------------------------------------------------------ No results for input(s): HGBA1C in the last 72  hours. ------------------------------------------------------------------------------------------------------------------ No results for input(s): CHOL, HDL, LDLCALC, TRIG, CHOLHDL, LDLDIRECT in the last 72 hours. ------------------------------------------------------------------------------------------------------------------ No results for input(s): TSH, T4TOTAL, T3FREE, THYROIDAB in the last 72 hours.  Invalid input(s): FREET3 ------------------------------------------------------------------------------------------------------------------ No results for input(s): VITAMINB12, FOLATE, FERRITIN, TIBC, IRON, RETICCTPCT in the last 72 hours.  Coagulation profile No results for input(s): INR, PROTIME in the last 168 hours.  No results for input(s): DDIMER in the last 72 hours.  Cardiac Enzymes No results for input(s): CKMB, TROPONINI, MYOGLOBIN in the last 168 hours.  Invalid input(s): CK ------------------------------------------------------------------------------------------------------------------ Invalid input(s): Jodean Lima MD PhD on 04/29/2015 at 11:09 AM  Between 7am to 7pm - Pager - [phone]  After 7pm go to [email or web address] - password [email or web address]  And look for the night coverage person covering for me after hours  [facility] Office  [phone]

## 2015-04-29 NOTE — Care Management Important Message (Signed)
Important Message  Patient Details  Name: Angela Stuart MRN: [id number] Date of Birth: Jul 13, 1931   Medicare Important Message Given:  Yes    Lolah Coghlan P Jaskirat Zertuche 04/29/2015, 12:20 PM

## 2015-04-29 NOTE — Consult Note (Addendum)
Advanced Heart Failure Consult Note  Referring MD: Dr. Carmon Ginsberg. Xu/ ReDs vest trial prompted. PCP: Recently moved from [place].  Primary Cardiologist: Dr. Algie Coffer  HPI:   Angela Stuart is a 82 y.o. female with h/o chronic systolic HF Echo 1/61/09 EF 35-40%, RV mildly dilated and function "is reduced", CAD s/p CABG 1971, DM, and HTN who presented to [facility] 04/21/15 with worsening edema, weakness, and fatigue x several weeks despite her daily lasix. She recently moved to area from . Pertinent admission labs K 4.4, Creatinine 1.43, BNP >4500, Troponins mildly elevated but flat/trending down.   Wt on admit 171. Down 9 lbs so far with output of only 1.7 L negative. Creatinine up over past several days 1.37 => 1.70 => 1.74. Myoview 04/27/15 with global hypokinesis, LVEF 21%, and low anterior wall ischemia, high risk stress test. Pt and family refuse cath. Opt for medical management at this time. Pt was enrolled in ReDs vest study and HF team consult prompted by reading of 39% lung water, indicated residual edema.   She states she has had stable DOE for months. She felt she has had worsening fatigue over the same amount of time. Also has chest pain, but is a poor historian as far as aggravating/relieving factors. Mostly limited in her ADLs by weakness, has been this way for "quite awhile" with steep worsening x 1 week.  Pt currently lives in independent living facility at [facility] on [address]. She had not been weighing daily prior to admission, no scale at home. Will purchase one.  Regularly eats high sodium foods (sausage, ham, chicken wings at home). She had planned to establish with PCP this week, will need to reschedule.   Review of Systems: [y] = yes,  = no   General: Weight gain [y]; Weight loss ; Anorexia ; Fatigue [Y]; Fever ; Chills ; Weakness [Y]  Cardiac: Chest pain/pressure [y]; Resting SOB ; Exertional SOB [Y]; Orthopnea [Y]; Pedal Edema [Y]; Palpitations ;  Syncope ; Presyncope ; Paroxysmal nocturnal dyspnea[ ]   Pulmonary: Cough [y]; Wheezing[ ] ; Hemoptysis[ ] ; Sputum ; Snoring   GI: Vomiting[ ] ; Dysphagia[ ] ; Melena[ ] ; Hematochezia ; Heartburn[ ] ; Abdominal pain ; Constipation ; Diarrhea ; BRBPR   GU: Hematuria[ ] ; Dysuria ; Nocturia[ ]   Vascular: Pain in legs with walking ; Pain in feet with lying flat ; Non-healing sores ; Stroke ; TIA ; Slurred speech ;  Neuro: Headaches[ ] ; Vertigo[ ] ; Seizures[ ] ; Paresthesias[ ] ;Blurred vision ; Diplopia ; Vision changes   Ortho/Skin: Arthritis [y]; Joint pain [y]; Muscle pain ; Joint swelling ; Back Pain ; Rash   Psych: Depression[ ] ; Anxiety[ ]   Heme: Bleeding problems ; Clotting disorders ; Anemia   Endocrine: Diabetes [y]; Thyroid dysfunction[y]   Past Medical History  Diagnosis Date  . CHF (congestive heart failure) (HCC)   . Hypertension   . Coronary artery disease   . Hyperlipidemia   . Myocardial infarction (HCC) 1970s X1  . Type II diabetes mellitus (HCC)   . Migraine     "stopped when I had hysterectomy"  . Fibromyalgia     "feet and arms" (04/22/2015)  . Anxiety   . Depression   . Fluid retention   . Low blood pressure   . Anemia    Social History  Social History  . Marital Status: Widowed    Spouse Name: N/A  . Number of Children: N/A  . Years of Education: N/A   Social History Main Topics  . Smoking status: Never Smoker   . Smokeless tobacco: Never Used  . Alcohol Use: No  . Drug Use: No  . Sexual Activity: No   Other Topics Concern  . None   Social History Narrative   Diet:  ?   Do you drink/eat things with caffeine?  Yes - coffee   Marital status:  Widowed.  What year were you married?  1957   Do you live in a house, apartment, assisted living, condo, trailer, etc.)?  Independent living apt   Is it one or more stories?  3   How many persons live in your home?  1   Do you have any  pets in your home? (please list)  No   Current or past profession:  Teacher   Do you exercise:  No, other than walking to mailbox      Family History  Problem Relation Age of Onset  . Heart failure Father 93    Deceased  . Stroke Mother 24    Deceased  . Skin cancer Sister 62    Living  . Alzheimer's disease Sister 49    Living  . Skin cancer Brother 69    Living  . Diabetes Mellitus II Son 38    Living  . Heart Problems Daughter 17    Living  . Cancer    . Arthritis Mother      Objective:   Weight Range: 162 lb 3.2 oz (73.573 kg) Body mass index is 26.19 kg/(m^2).   Vital Signs:   Temp:  [97.5 F (36.4 C)-98.2 F (36.8 C)] 98.2 F (36.8 C) (01/19 0422) Pulse Rate:  [82-90] 85 (01/19 0422) Resp:  [18-20] 18 (01/19 0422) BP: (90-127)/(32-73) 114/73 mmHg (01/19 0422) SpO2:  [98 %-100 %] 100 % (01/19 0422) Weight:  [162 lb 3.2 oz (73.573 kg)] 162 lb 3.2 oz (73.573 kg) (01/19 0338) Last BM Date: 04/28/15  Weight change: Filed Weights   04/27/15 0544 04/28/15 0548 04/29/15 0338  Weight: 168 lb 6.9 oz (76.4 kg) 160 lb 4.4 oz (72.7 kg) 162 lb 3.2 oz (73.573 kg)    Intake/Output:   Intake/Output Summary (Last 24 hours) at 04/29/15 1017 Last data filed at 04/29/15 0954  Gross per 24 hour  Intake    840 ml  Output    900 ml  Net    -60 ml     Physical Exam: General: Chronically ill and elderly appearing. NAD HEENT: normal Neck: supple. JVP to jaw. Carotids 2+ bilat; no bruits. No thyromegaly or nodule noted Cor: PMI nondisplaced. RRR, 3/6 TR, 2/6 MR murmur. +S3 Lungs: Slight basilar crackles Abdomen: soft, NT, moderately distended, no HSM. No bruits or masses. +BS Extremities: no cyanosis, clubbing, rash, 2-3+ edema, extremities cool to the touch Neuro: alert & orientedx3, cranial nerves grossly intact. moves all 4 extremities w/o difficulty. Affect pleasant   Telemetry: Reviewed personally, NSR 80s with PVCs  Labs: CBC  Recent Labs  04/28/15 0356  04/29/15 0320  WBC 9.0 7.3  HGB 13.3 13.1  HCT 41.8 40.5  MCV 89.1 87.5  PLT 172 155   Basic Metabolic Panel  Recent Labs  04/27/15 0530 04/28/15 0356 04/29/15 0320  NA 137 138 140  K 3.7 4.0 4.2  CL 105 106 106  CO2 23 22 24  GLUCOSE 117* 100* 113*  BUN 43* 51* 52*  CREATININE 1.37* 1.70* 1.74*  CALCIUM 9.0 9.6 9.4  MG 1.5*  --   --    Liver Function Tests No results for input(s): AST, ALT, ALKPHOS, BILITOT, PROT, ALBUMIN in the last 72 hours. No results for input(s): LIPASE, AMYLASE in the last 72 hours. Cardiac Enzymes No results for input(s): CKTOTAL, CKMB, CKMBINDEX, TROPONINI in the last 72 hours.  BNP: BNP (last 3 results)  Recent Labs  04/21/15 1939 04/29/15 0320  BNP >4500.0* >4500.0*    Imaging/Studies:  Dg Hip Unilat With Pelvis 2-3 Views Right  04/28/2015  CLINICAL DATA:  82 year old female who fell on steps this morning. Posterior right hip pain. Initial encounter. EXAM: DG HIP (WITH OR WITHOUT PELVIS) 2-3V RIGHT COMPARISON:  None. FINDINGS: Osteopenia. Extensive aortic, iliac, and femoral artery calcified atherosclerosis. Femoral heads are normally located. No pelvic fracture identified. Proximal left femur appears grossly intact. Proximal right femur appears intact. SI joints within normal limits. IMPRESSION: No acute fracture or dislocation identified about the right hip per pelvis. If occult hip fracture is suspected or if the patient is unable to weightbear, MRI is the preferred modality for further evaluation. Electronically Signed   By: Odessa Fleming M.D.   On: 04/28/2015 12:14       Medications:     Scheduled Medications: . aspirin EC  81 mg Oral Daily  . citalopram  10 mg Oral Daily  . enoxaparin (LOVENOX) injection  30 mg Subcutaneous Q24H  . ferrous sulfate  325 mg Oral BID  . furosemide  40 mg Oral BID  . hydrocortisone   Rectal TID  . insulin aspart  0-15 Units Subcutaneous TID WC  . insulin glargine  12 Units Subcutaneous q morning -  10a  . rosuvastatin  5 mg Oral QPM     Infusions:     PRN Medications:  sodium chloride, acetaminophen, ondansetron (ZOFRAN) IV, polyvinyl alcohol, promethazine, sodium chloride  ECG: Wandering atrial pacemaker 87 previous anterolateral infarct. laft axis No ST-T wave abnormalities.     Assessment/Plan  Angela Stuart is a 82 y.o. female with h/o chronic systolic HF Echo 1/61/09 EF 35-40%, RV mildly dilated and function "is reduced", CAD s/p CABG, DM, and HTN who presented to [facility] 04/21/15 with worsening edema, weakness, and fatigue x several weeks despite her daily lasix. HF team consulted with ReDs vest reading 39%.  1. A/C systolic HF, EF 35-40% by echo 04/23/15, though 21% by myoview 04/27/15. Ischemic CM, NYHA Class IIIb-IV - on reviewing echo, Dr. Gala Romney estimates EF of 20-25%, with severe MR/TR and restrictive filling pattern  - She remains markedly volume overloaded on exam, with +S3, JVP to jaw, and cool extremities. Concerned for low output HF.  - She received 80 mg IV lasix BID x 3 days with only modest diuresis. Will start back lasix 80 mg IV BID and gauge response.  May need more. - Will start on milrinone 0.25 mcg/kg/min. Pt presents as low output. Will wait to talk with family for discussion of PICC line placement vs L/RHC.  - She is not on any disease modifying meds at this time.  - No ACEi/ARB/ARNI currently with AKI.  - No BB with concerns for low output. Previously on Coreg CR 10 mg daily.  - Agree with going to SNF on d/c. Will need close HF follow up on discharge, but will like 2. CAD s/p CABG (records currently unavailable, from [facility]) - High risk myoview 04/27/15  after abnormal troponins. Pt and family discussed risks of heart catheterization with Dr Algie Coffer 04/27/15. Pt and family have refused at this time. Wish to proceed with medical management - Re-discussed cardiac catheterization today. Concerns for low output and worsening CAD by results of myoview. -  Continue ASA 81 mg and rosuvastatin 5 mg daily. (intolerant to Lipitor with N/V) 3. AKi on CKD stage III - Continue to follow closely with diuresis. May be limiting.  4. Endocrine - DM and Thyroid per primary. 5. Fall 04/28/15 while working with therapy. - Xray negative for Hip Fx. To go to SNF on d/c.  6. DRN/DNI  Length of Stay: 8   Graciella Freer PA-C 04/29/2015, 10:17 AM  Advanced Heart Failure Team Pager [phone] (M-F; 7a - 4p)  Please contact [facility] for night-coverage after hours (4p -7a ) and weekends on [email or web address]  Patient seen and examined with Otilio Saber, PA-C. We discussed all aspects of the encounter. I agree with the assessment and plan as stated above.   She is 82 y/o woman with h/o CAD s/p CABG [place], DM now living at [facility]. She presents with decompensated HF with NYHA IIIB-IV symptoms. Echo reviewed c/w ischemic CM with EF 20-25 with moderate RV dysfunction, severe MR/TR and restrictive filling pattern. Myoview also reviewed personally with EF 21% and significant anterolateral and inferior ischemia.  Diuresis limited by worsening renal function. She was seen by Dr. Algie Coffer and cath suggested but family refused. Vest placed today and showed persistent volume overload with lung water 39% (normal range 25-35%). Suspect she has low output HF. I concurred with Dr. Algie Coffer and suggested cath but I was unable to reach her family to discuss further. Thus, at this point would focus on optimizing her hemodynamics and volume status. Will place PICC to get mixed venous sat and then support diuresis with brief course of milrinone. Will await for PICC placement to resume IV lasix. The HF team will follow and collaborate with Dr. Algie Coffer.   Stormi Vandevelde,MD 11:46 AM

## 2015-04-29 NOTE — Consult Note (Signed)
Ref: GREEN, Lenon Curt, MD   Subjective:  Patient and son still wants medical therapy. Decreased leg edema with [facility] use. Improving ambulation per PT.  Objective:  Vital Signs in the last 24 hours: Temp:  [97.4 F (36.3 C)-98.2 F (36.8 C)] 97.4 F (36.3 C) (01/19 1211) Pulse Rate:  [82-90] 85 (01/19 1211) Cardiac Rhythm:  [-] Heart block (01/19 0840) Resp:  [18] 18 (01/19 1211) BP: (90-127)/(32-73) 114/58 mmHg (01/19 1211) SpO2:  [98 %-100 %] 98 % (01/19 1211) Weight:  [73.573 kg (162 lb 3.2 oz)] 73.573 kg (162 lb 3.2 oz) (01/19 0338)  Physical Exam: BP Readings from Last 1 Encounters:  04/29/15 114/58    Wt Readings from Last 1 Encounters:  04/29/15 73.573 kg (162 lb 3.2 oz)    Weight change: 0.873 kg (1 lb 14.8 oz)  HEENT: [place]/AT, Eyes- PERL, EOMI, Conjunctiva-Pink, Sclera-Non-icteric Neck: No JVD, No bruit, Trachea midline. Lungs:  Clearing, Bilateral. Cardiac:  Regular rhythm, normal S1 and S2, no S3. II/VI systolic murmur Abdomen:  Soft, non-tender. Extremities: 1 + edema present. No cyanosis. No clubbing. CNS: AxOx3, Cranial nerves grossly intact, moves all 4 extremities. Right handed. Skin: Warm and dry.   Intake/Output from previous day: 01/18 0701 - 01/19 0700 In: 600 [P.O.:600] Out: 900 [Urine:900]    Lab Results: BMET    Component Value Date/Time   NA 140 04/29/2015 0320   NA 138 04/28/2015 0356   NA 137 04/27/2015 0530   K 4.2 04/29/2015 0320   K 4.0 04/28/2015 0356   K 3.7 04/27/2015 0530   CL 106 04/29/2015 0320   CL 106 04/28/2015 0356   CL 105 04/27/2015 0530   CO2 24 04/29/2015 0320   CO2 22 04/28/2015 0356   CO2 23 04/27/2015 0530   GLUCOSE 113* 04/29/2015 0320   GLUCOSE 100* 04/28/2015 0356   GLUCOSE 117* 04/27/2015 0530   BUN 52* 04/29/2015 0320   BUN 51* 04/28/2015 0356   BUN 43* 04/27/2015 0530   CREATININE 1.74* 04/29/2015 0320   CREATININE 1.70* 04/28/2015 0356   CREATININE 1.37* 04/27/2015 0530   CALCIUM 9.4 04/29/2015 0320    CALCIUM 9.6 04/28/2015 0356   CALCIUM 9.0 04/27/2015 0530   GFRNONAA 26* 04/29/2015 0320   GFRNONAA 27* 04/28/2015 0356   GFRNONAA 35* 04/27/2015 0530   GFRAA 30* 04/29/2015 0320   GFRAA 31* 04/28/2015 0356   GFRAA 41* 04/27/2015 0530   CBC    Component Value Date/Time   WBC 7.3 04/29/2015 0320   RBC 4.63 04/29/2015 0320   HGB 13.1 04/29/2015 0320   HCT 40.5 04/29/2015 0320   PLT 155 04/29/2015 0320   MCV 87.5 04/29/2015 0320   MCH 28.3 04/29/2015 0320   MCHC 32.3 04/29/2015 0320   RDW 15.4 04/29/2015 0320   LYMPHSABS 1.5 04/21/2015 1938   MONOABS 0.5 04/21/2015 1938   EOSABS 0.2 04/21/2015 1938   BASOSABS 0.0 04/21/2015 1938   HEPATIC Function Panel  Recent Labs  04/24/15 1151  PROT 6.3*   HEMOGLOBIN A1C No components found for: HGA1C,  MPG CARDIAC ENZYMES Lab Results  Component Value Date   TROPONINI 0.13* 04/22/2015   TROPONINI 0.15* 04/22/2015   TROPONINI 0.16* 04/21/2015   BNP No results for input(s): PROBNP in the last 8760 hours. TSH  Recent Labs  04/24/15 1151  TSH 5.028*   CHOLESTEROL No results for input(s): CHOL in the last 8760 hours.  Scheduled Meds: . aspirin EC  81 mg Oral Daily  . citalopram  10 mg Oral Daily  . enoxaparin (LOVENOX) injection  30 mg Subcutaneous Q24H  . ferrous sulfate  325 mg Oral BID  . hydrocortisone   Rectal TID  . insulin aspart  0-15 Units Subcutaneous TID WC  . insulin glargine  12 Units Subcutaneous q morning - 10a  . rosuvastatin  5 mg Oral QPM   Continuous Infusions:  PRN Meds:.sodium chloride, acetaminophen, clonazePAM, ondansetron (ZOFRAN) IV, oxyCODONE-acetaminophen, polyvinyl alcohol, promethazine, sodium chloride, sodium chloride  Assessment/Plan: Acute on chronic left heart systolic failure Ischemic cardiomyopathy Acute kidney injury vs CKD Abnormal troponin-I from renal insufficiency or demand ischemia DM, II Dyslipidemia Depression Hypothyroidism unlikely with elevated free T 4 and  borderline TSH elevation in sick patient. Hypoalbuminemia  Continue fluid restriction and oral lasix use. F/U in 1 weeks post discharge.    LOS: 8 days    Orpah Cobb  MD  04/29/2015, 2:30 PM

## 2015-04-29 NOTE — Progress Notes (Signed)
Physical Therapy Treatment Patient Details Name: Angela Stuart MRN: [id number] DOB: [birth year] Today's Date: 04/29/2015    History of Present Illness 82 yo female with onset of CHF and renal insufficiency is having elevated troponin from demand ischemia, LE edema with RLE redness, pericardial effusion.  PMHx:  CABG, CHF    PT Comments    Patient with improved activity tolerance this session. Ambulated increased distance in hall with one extended seated rest break. Discussed there ex for patient to before to facilitate bilateral LE strength.  HR stable min 80s with activity and no SOB noted.   Follow Up Recommendations  SNF;Supervision/Assistance - 24 hour     Equipment Recommendations  Rolling walker with 5" wheels    Recommendations for Other Services       Precautions / Restrictions Precautions Precautions: Fall (telemetry) Restrictions Weight Bearing Restrictions: No    Mobility  Bed Mobility               General bed mobility comments: received in chair  Transfers Overall transfer level: Needs assistance Equipment used: Rolling walker (2 wheeled)   Sit to Stand: Min guard         General transfer comment: VCs for hand placement and positioning at the edge of chair  Ambulation/Gait Ambulation/Gait assistance: Min guard Ambulation Distance (Feet): 160 Feet Assistive device: Rolling walker (2 wheeled);1 person hand held assist Gait Pattern/deviations: Step-through pattern;Decreased stride length;Shuffle;Trunk flexed;Antalgic Gait velocity: decreased Gait velocity interpretation: Below normal speed for age/gender General Gait Details: patient ambulated in hall with one extended seated rest break upon fatigue. Tolerated well, HR stable and denies SOB   Stairs            Wheelchair Mobility    Modified Rankin (Stroke Patients Only)       Balance     Sitting balance-Leahy Scale: Good       Standing balance-Leahy Scale: Fair                      Cognition Arousal/Alertness: Awake/alert Behavior During Therapy: WFL for tasks assessed/performed Overall Cognitive Status: Within Functional Limits for tasks assessed                      Exercises Other Exercises Other Exercises: educated on energy conservation techniques Other Exercises: educated on general LE ther ex to perform in room including quad sets, LAQs, SLRs and ABD/ADD.     General Comments        Pertinent Vitals/Pain Pain Assessment: 0-10 Pain Score: 5  Pain Location: low back pain Pain Descriptors / Indicators: Sore Pain Intervention(s): Monitored during session;Limited activity within patient's tolerance;Repositioned;Relaxation    Home Living                      Prior Function            PT Goals (current goals can now be found in the care plan section) Acute Rehab PT Goals Patient Stated Goal: to walk safely and get home PT Goal Formulation: With patient Time For Goal Achievement: 05/08/15 Potential to Achieve Goals: Good Progress towards PT goals: Progressing toward goals    Frequency  Min 2X/week    PT Plan Other (comment) (discharge plan is concerning)    Co-evaluation             End of Session Equipment Utilized During Treatment: Gait belt Activity Tolerance: Patient tolerated treatment well Patient left: in chair;with call bell/phone  within reach;with chair alarm set     Time: [phone] PT Time Calculation (min) (ACUTE ONLY): 19 min  Charges:  $Gait Training: 8-22 mins                    G CodesFabio Asa 05-27-15, 1:08 PM Charlotte Crumb, PT DPT  [phone]

## 2015-04-30 DIAGNOSIS — Z7189 Other specified counseling: Secondary | ICD-10-CM

## 2015-04-30 DIAGNOSIS — E876 Hypokalemia: Secondary | ICD-10-CM | POA: Insufficient documentation

## 2015-04-30 DIAGNOSIS — N179 Acute kidney failure, unspecified: Secondary | ICD-10-CM | POA: Insufficient documentation

## 2015-04-30 DIAGNOSIS — N184 Chronic kidney disease, stage 4 (severe): Secondary | ICD-10-CM

## 2015-04-30 DIAGNOSIS — I5023 Acute on chronic systolic (congestive) heart failure: Secondary | ICD-10-CM | POA: Insufficient documentation

## 2015-04-30 DIAGNOSIS — Z515 Encounter for palliative care: Secondary | ICD-10-CM

## 2015-04-30 LAB — GLUCOSE, CAPILLARY
Glucose-Capillary: 99 mg/dL (ref 65–99)
Glucose-Capillary: 144 mg/dL — ABNORMAL HIGH (ref 65–99)
Glucose-Capillary: 147 mg/dL — ABNORMAL HIGH (ref 65–99)

## 2015-04-30 LAB — CARBOXYHEMOGLOBIN
Carboxyhemoglobin: 1.7 % — ABNORMAL HIGH (ref 0.5–1.5)
Methemoglobin: 0.8 % (ref 0.0–1.5)
O2 Saturation: 70.2 %
Total hemoglobin: 10.4 g/dL — ABNORMAL LOW (ref 12.0–16.0)

## 2015-04-30 LAB — BASIC METABOLIC PANEL
ANION GAP: 10 (ref 5–15)
BUN: 47 mg/dL — ABNORMAL HIGH (ref 6–20)
CALCIUM: 9.3 mg/dL (ref 8.9–10.3)
CO2: 26 mmol/L (ref 22–32)
CREATININE: 1.41 mg/dL — AB (ref 0.44–1.00)
Chloride: 104 mmol/L (ref 101–111)
GFR, EST AFRICAN AMERICAN: 39 mL/min — AB (ref >60)
GFR, EST NON AFRICAN AMERICAN: 34 mL/min — AB (ref >60)
Glucose, Bld: 182 mg/dL — ABNORMAL HIGH (ref 65–99)
Potassium: 3.1 mmol/L — ABNORMAL LOW (ref 3.5–5.1)
Sodium: 140 mmol/L (ref 135–145)

## 2015-04-30 MED ORDER — METOLAZONE 2.5 MG PO TABS
2.5000 mg | ORAL_TABLET | Freq: Once | ORAL | Status: AC
  Administered 2015-04-30: 2.5 mg via ORAL
  Filled 2015-04-30: qty 1

## 2015-04-30 MED ORDER — POTASSIUM CHLORIDE CRYS ER 20 MEQ PO TBCR
40.0000 meq | EXTENDED_RELEASE_TABLET | Freq: Once | ORAL | Status: AC
  Administered 2015-04-30: 40 meq via ORAL
  Filled 2015-04-30: qty 2

## 2015-04-30 MED ORDER — POTASSIUM CHLORIDE CRYS ER 20 MEQ PO TBCR
40.0000 meq | EXTENDED_RELEASE_TABLET | Freq: Every day | ORAL | Status: DC
  Administered 2015-05-01: 40 meq via ORAL
  Filled 2015-04-30: qty 2

## 2015-04-30 MED ORDER — ISOSORB DINITRATE-HYDRALAZINE 20-37.5 MG PO TABS
0.5000 | ORAL_TABLET | Freq: Three times a day (TID) | ORAL | Status: DC
  Administered 2015-04-30 – 2015-05-05 (×17): 0.5 via ORAL
  Filled 2015-04-30 (×18): qty 1

## 2015-04-30 NOTE — Progress Notes (Signed)
Ref: GREEN, Lenon Curt, MD   Subjective:  Sitting up to self feed. Discussed care with Dr. Neale Burly, Dr. Gonzella Lex , son and patient. Will change DNR to MOST.    Objective:  Vital Signs in the last 24 hours: Temp:  [97.4 F (36.3 C)-97.8 F (36.6 C)] 97.8 F (36.6 C) (01/20 1100) Pulse Rate:  [71-87] 87 (01/20 1100) Cardiac Rhythm:  [-] Normal sinus rhythm (01/20 0900) Resp:  [18] 18 (01/20 1100) BP: (112-135)/(56-68) 113/60 mmHg (01/20 1100) SpO2:  [95 %-100 %] 95 % (01/20 1100) Weight:  [74.1 kg (163 lb 5.8 oz)] 74.1 kg (163 lb 5.8 oz) (01/20 0420)  Physical Exam: BP Readings from Last 1 Encounters:  04/30/15 113/60    Wt Readings from Last 1 Encounters:  04/30/15 74.1 kg (163 lb 5.8 oz)    Weight change: 0.526 kg (1 lb 2.6 oz)  HEENT: [place]/AT, Eyes- PERL, EOMI, Conjunctiva-Pink, Sclera-Non-icteric Neck: No JVD, No bruit, Trachea midline. Lungs:  Clearing, Bilateral. Cardiac:  Regular rhythm, normal S1 and S2, no S3. II/VI systolic murmur Abdomen:  Soft, non-tender. Extremities:  1+ edema present. No cyanosis. No clubbing. CNS: AxOx3, Cranial nerves grossly intact, moves all 4 extremities. Right handed. Skin: Warm and dry.   Intake/Output from previous day: 01/19 0701 - 01/20 0700 In: 277.1 [P.O.:240; I.V.:37.1] Out: 1000 [Urine:1000]    Lab Results: BMET    Component Value Date/Time   NA 140 04/30/2015 1200   NA 140 04/29/2015 0320   NA 138 04/28/2015 0356   K 3.1* 04/30/2015 1200   K 4.2 04/29/2015 0320   K 4.0 04/28/2015 0356   CL 104 04/30/2015 1200   CL 106 04/29/2015 0320   CL 106 04/28/2015 0356   CO2 26 04/30/2015 1200   CO2 24 04/29/2015 0320   CO2 22 04/28/2015 0356   GLUCOSE 182* 04/30/2015 1200   GLUCOSE 113* 04/29/2015 0320   GLUCOSE 100* 04/28/2015 0356   BUN 47* 04/30/2015 1200   BUN 52* 04/29/2015 0320   BUN 51* 04/28/2015 0356   CREATININE 1.41* 04/30/2015 1200   CREATININE 1.74* 04/29/2015 0320   CREATININE 1.70* 04/28/2015 0356    CALCIUM 9.3 04/30/2015 1200   CALCIUM 9.4 04/29/2015 0320   CALCIUM 9.6 04/28/2015 0356   GFRNONAA 34* 04/30/2015 1200   GFRNONAA 26* 04/29/2015 0320   GFRNONAA 27* 04/28/2015 0356   GFRAA 39* 04/30/2015 1200   GFRAA 30* 04/29/2015 0320   GFRAA 31* 04/28/2015 0356   CBC    Component Value Date/Time   WBC 7.3 04/29/2015 0320   RBC 4.63 04/29/2015 0320   HGB 13.1 04/29/2015 0320   HCT 40.5 04/29/2015 0320   PLT 155 04/29/2015 0320   MCV 87.5 04/29/2015 0320   MCH 28.3 04/29/2015 0320   MCHC 32.3 04/29/2015 0320   RDW 15.4 04/29/2015 0320   LYMPHSABS 1.5 04/21/2015 1938   MONOABS 0.5 04/21/2015 1938   EOSABS 0.2 04/21/2015 1938   BASOSABS 0.0 04/21/2015 1938   HEPATIC Function Panel  Recent Labs  04/24/15 1151  PROT 6.3*   HEMOGLOBIN A1C No components found for: HGA1C,  MPG CARDIAC ENZYMES Lab Results  Component Value Date   TROPONINI 0.13* 04/22/2015   TROPONINI 0.15* 04/22/2015   TROPONINI 0.16* 04/21/2015   BNP No results for input(s): PROBNP in the last 8760 hours. TSH  Recent Labs  04/24/15 1151  TSH 5.028*   CHOLESTEROL No results for input(s): CHOL in the last 8760 hours.  Scheduled Meds: . aspirin EC  81 mg Oral Daily  . citalopram  10 mg Oral Daily  . enoxaparin (LOVENOX) injection  30 mg Subcutaneous Q24H  . ferrous sulfate  325 mg Oral BID  . furosemide  80 mg Intravenous BID  . hydrocortisone   Rectal TID  . insulin aspart  0-15 Units Subcutaneous TID WC  . insulin glargine  12 Units Subcutaneous q morning - 10a  . isosorbide-hydrALAZINE  0.5 tablet Oral TID  . metolazone  2.5 mg Oral Once  . [START ON 05/01/2015] potassium chloride  40 mEq Oral Daily  . rosuvastatin  5 mg Oral QPM   Continuous Infusions: . milrinone 0.25 mcg/kg/min (04/30/15 1035)   PRN Meds:.sodium chloride, acetaminophen, clonazePAM, ondansetron (ZOFRAN) IV, oxyCODONE-acetaminophen, polyvinyl alcohol, promethazine, sodium chloride, sodium  chloride  Assessment/Plan: Acute on chronic left heart systolic failure Ischemic cardiomyopathy Acute on chronic renal failure Abnormal troponin-I from renal insufficiency or demand ischemia DM, II Dyslipidemia Depression Hypothyroidism unlikely with elevated free T 4 and borderline TSH elevation in sick patient. Hypoalbuminemia  EKG-SR with PAC + fusion beat + non-specific T wave change. Continue medical treatment. Dr. Sharyn Lull covering for weekend. Appreciate heart failure team consult.   LOS: 9 days    Orpah Cobb  MD  04/30/2015, 5:52 PM

## 2015-04-30 NOTE — Progress Notes (Signed)
Advanced Heart Failure Rounding Note  PCP: None per pt. Recently moved to [place] from [place].  Primary Cardiologist: Algie Coffer  Subjective:    Feeling some better but very tired today.  Peeing a lot. Remains very swollen.  No CP  Out ~1.3 x 24 hrs.  BMET pending. On lasix 80 mg IV BID currently.   Objective:   Weight Range: 163 lb 5.8 oz (74.1 kg) Body mass index is 26.38 kg/(m^2).   Vital Signs:   Temp:  [97.4 F (36.3 C)-97.8 F (36.6 C)] 97.5 F (36.4 C) (01/20 0420) Pulse Rate:  [71-85] 71 (01/20 0420) Resp:  [18] 18 (01/20 0420) BP: (114-135)/(58-68) 131/67 mmHg (01/20 0420) SpO2:  [96 %-100 %] 96 % (01/20 0420) Weight:  [163 lb 5.8 oz (74.1 kg)] 163 lb 5.8 oz (74.1 kg) (01/20 0420) Last BM Date: 04/28/15  Weight change: Filed Weights   04/28/15 0548 04/29/15 0338 04/30/15 0420  Weight: 160 lb 4.4 oz (72.7 kg) 162 lb 3.2 oz (73.573 kg) 163 lb 5.8 oz (74.1 kg)    Intake/Output:   Intake/Output Summary (Last 24 hours) at 04/30/15 9604 Last data filed at 04/30/15 0855  Gross per 24 hour  Intake 397.13 ml  Output   1450 ml  Net -1052.87 ml     Physical Exam: General: Chronically ill and elderly appearing. NAD HEENT: normal Neck: supple. JVP to jaw. Carotids 2+ bilat; no bruits. No thyromegaly or lymphadenopathy noted Cor: PMI nondisplaced. RRR, 3/6 TR, 2/6 MR murmur. +S3 Lungs: Slight basilar crackles, normal effort.  Abdomen: soft, NT, mild/moderately distended, no HSM. No bruits or masses. +BS Extremities: no cyanosis, clubbing, rash, 2-3+ edema, 1+ into thighs, extremities more warm. Neuro: alert & orientedx3, cranial nerves grossly intact. moves all 4 extremities w/o difficulty. Affect pleasant  Telemetry: Reviewed personally, NSR 80s with occasional PVCs.   Labs: CBC  Recent Labs  04/28/15 0356 04/29/15 0320  WBC 9.0 7.3  HGB 13.3 13.1  HCT 41.8 40.5  MCV 89.1 87.5  PLT 172 155   Basic Metabolic Panel  Recent Labs  04/28/15 0356 04/29/15 0320   NA 138 140  K 4.0 4.2  CL 106 106  CO2 22 24  GLUCOSE 100* 113*  BUN 51* 52*  CREATININE 1.70* 1.74*  CALCIUM 9.6 9.4   Liver Function Tests No results for input(s): AST, ALT, ALKPHOS, BILITOT, PROT, ALBUMIN in the last 72 hours. No results for input(s): LIPASE, AMYLASE in the last 72 hours. Cardiac Enzymes No results for input(s): CKTOTAL, CKMB, CKMBINDEX, TROPONINI in the last 72 hours.  BNP: BNP (last 3 results)  Recent Labs  04/21/15 1939 04/29/15 0320  BNP >4500.0* >4500.0*    ProBNP (last 3 results) No results for input(s): PROBNP in the last 8760 hours.   D-Dimer No results for input(s): DDIMER in the last 72 hours. Hemoglobin A1C No results for input(s): HGBA1C in the last 72 hours. Fasting Lipid Panel No results for input(s): CHOL, HDL, LDLCALC, TRIG, CHOLHDL, LDLDIRECT in the last 72 hours. Thyroid Function Tests No results for input(s): TSH, T4TOTAL, T3FREE, THYROIDAB in the last 72 hours.  Invalid input(s): FREET3  Other results:     Imaging/Studies:  Dg Chest Port 1 View  04/29/2015  CLINICAL DATA:  Right PICC line placement EXAM: PORTABLE CHEST 1 VIEW COMPARISON:  04/21/2015 FINDINGS: Cardiomegaly again noted. Status post median sternotomy. Atherosclerotic calcifications of thoracic aorta again noted. Again noted left basilar atelectasis. There is right arm PICC line with tip in right atrium.  No pneumothorax. For distal SVC position the PICC line should be retracted about 2 cm. No infiltrate or pulmonary edema. IMPRESSION: Cardiomegaly again noted. No segmental infiltrate or pulmonary edema.There is right arm PICC line with tip in right atrium. No pneumothorax. For distal SVC position the PICC line should be retracted about 2 cm. Electronically Signed   By: Natasha Mead M.D.   On: 04/29/2015 14:36   Dg Hip Unilat With Pelvis 2-3 Views Right  04/28/2015  CLINICAL DATA:  82 year old female who fell on steps this morning. Posterior right hip pain. Initial  encounter. EXAM: DG HIP (WITH OR WITHOUT PELVIS) 2-3V RIGHT COMPARISON:  None. FINDINGS: Osteopenia. Extensive aortic, iliac, and femoral artery calcified atherosclerosis. Femoral heads are normally located. No pelvic fracture identified. Proximal left femur appears grossly intact. Proximal right femur appears intact. SI joints within normal limits. IMPRESSION: No acute fracture or dislocation identified about the right hip per pelvis. If occult hip fracture is suspected or if the patient is unable to weightbear, MRI is the preferred modality for further evaluation. Electronically Signed   By: Odessa Fleming M.D.   On: 04/28/2015 12:14     Latest Echo  Latest Cath   Medications:     Scheduled Medications: . aspirin EC  81 mg Oral Daily  . citalopram  10 mg Oral Daily  . enoxaparin (LOVENOX) injection  30 mg Subcutaneous Q24H  . ferrous sulfate  325 mg Oral BID  . furosemide  80 mg Intravenous BID  . hydrocortisone   Rectal TID  . insulin aspart  0-15 Units Subcutaneous TID WC  . insulin glargine  12 Units Subcutaneous q morning - 10a  . lisinopril  2.5 mg Oral Daily  . rosuvastatin  5 mg Oral QPM     Infusions: . milrinone 0.25 mcg/kg/min (04/29/15 2041)     PRN Medications:  sodium chloride, acetaminophen, clonazePAM, ondansetron (ZOFRAN) IV, oxyCODONE-acetaminophen, polyvinyl alcohol, promethazine, sodium chloride, sodium chloride   Assessment   1. A/C systolic HF, Echo 04/23/15 initially read as 35-40%, looks more like 20-25%. Myoview 04/27/15 EF 21% 2. CAD s/p CABG x 4 1971 3. AKI on CKD stage III 4. Endocrine 5. S/p Fall 04/28/15 6. DNR/DNI - family meeting scheduled for 1/20 @ 1630  Plan   Coox 70.2% this am on milrinone 0.25. BMET pending. Hold lisinopril with pending BMET and AKI.   She remains markedly volume overloaded on exam. She had minimal diuresis yesterday but picked up once milrinone placed on. 1.4L output so far today. Will add dose of 2.5 mg metolazone for  this pm.   Palliative care family meeting this pm.   Length of Stay: [address]   Graciella Freer PA-C 04/30/2015, 9:22 AM  Advanced Heart Failure Team Pager [phone] (M-F; 7a - 4p)  Please contact [facility] for night-coverage after hours (4p -7a ) and weekends on [email or web address]   Patient seen and examined with Otilio Saber, PA-C. We discussed all aspects of the encounter. I agree with the assessment and plan as stated above.   She remains weak. Co-ox low suggestive of low cardiac output. Output now improved on milrinone. Diuresis remains sluggish despite IV lasix. Weight actually up today.  Will add metolazone. She has significant volume on board. Would like to see her diurese another 5-10 pounds prior to d/c. Follow electrolytes closely. Now b-blocker currently. Would hold lisinopril with worsening renal function. Start bidil 1/2 tab tid. Place TED hose. The HF team will follow over the weekend. Refuses  cath.   Mitul Hallowell,MD 11:10 AM

## 2015-04-30 NOTE — Progress Notes (Signed)
[facility] PROGRESS NOTE  Angela Stuart ZOX:[id number] DOB: [birth year] DOA: 04/21/2015 PCP: Kimber Relic, MD  Brief narrative 82 year old female with history of severe systolic CHF (EF of 35-40%) with severe mitral regurgitation, diastolic dysfunction, history of CAD status post CABG, type 2 diabetes mellitus and hypertension , recently moved from [name] to live with Angela Stuart presented with several weeks of generalized weakness with fatigue and increased peripheral edema. Patient was unable to walk on the day of admission and did not improved with Angela home dose of Lasix. Patient found to be in acute CHF and admitted to hospitalist service. Patient diuresed poorly with IV Lasix and had to be placed on milrinone drip. Cardiology/heart failure team and palliative care consulted.   Assessment/Plan: Acute combined systolic and diastolic CHF exacerbation Echo showing EF of 35 and 40% with severe MR posteriorly and diastolic dysfunction. BNP >4500. On IV Lasix but appears volume overloaded today. Diuresis improved after placing Angela on milrinone drip. Added metolazone today. Lisinopril discontinued due to worsening acute kidney injury. Added BiDil. NT aspirin and statin. Appreciate cardiology and heart failure team follow-up.  Lower extremity venous efficiency  Appears to be chronic as per patient.  No clinical signs of infection. Antibiotic discontinued. Lower extremity Doppler negative for DVT.  Acute versus acute on chronic kidney injury. Possibly associated with diuresis. Renal ultrasound unremarkable. Metformin and ACE inhibitor held. Creatinine improved today.  Type 2 diabetes mellitus A1c of 8.2. Metformin held. Continue home dose Lantus with sliding scale coverage. Diabetic coordinator consulted.  Hypokalemia  replenished.  Depression Continue Celexa  Incidental finding of liver cirrhosis on renal ultrasound. LFTs normal. Continue to monitor.  Elevated TSH Free T4  of 1.59. Recommend repeat thyroid function once acute illness resolved.  Chronic right hip pain with physical deconditioning No fractures on x-ray PT recommends skilled nursing facility. Palliative care consulted with plan on Stuart meeting today.   DVT prophylaxis: Subcutaneous Lovenox  Diet: Heart healthy/diabetic  Code Status:DNR Stuart Communication: none at bedside Disposition Plan: Continue inpatient monitoring.   Consultants:  cardiology ( Dr Algie Coffer)  palliative care  Procedures:  2-D echo  Antibiotics:  Doxycycline (stopped)  HPI/Subjective: Reports breathing to be slightly better  Objective: Filed Vitals:   04/30/15 0047 04/30/15 0420  BP: 135/61 131/67  Pulse: 80 71  Temp: 97.8 F (36.6 C) 97.5 F (36.4 C)  Resp: 18 18    Intake/Output Summary (Last 24 hours) at 04/30/15 0948 Last data filed at 04/30/15 0855  Gross per 24 hour  Intake 397.13 ml  Output   1450 ml  Net -1052.87 ml   Filed Weights   04/28/15 0548 04/29/15 0338 04/30/15 0420  Weight: 72.7 kg (160 lb 4.4 oz) 73.573 kg (162 lb 3.2 oz) 74.1 kg (163 lb 5.8 oz)    Exam:   General:  NAD  HEENT: JVD+, moist mucosa  Cardiovascular: loud S1, normal S2, 3/6 Systolic murmur  Respiratory: diminished breath sounds b/l  Abdomen: soft, ND, NT, BS+  Musculoskeletal: warm, erythema b/l leg (mainly in left, chronic), 2+ pitting edema  CNS: alert and oriented  Data Reviewed: Basic Metabolic Panel:  Recent Labs Lab 04/23/15 1143  04/25/15 0230 04/26/15 0337 04/27/15 0530 04/28/15 0356 04/29/15 0320  NA  --   < > 136 138 137 138 140  K  --   < > 3.6 3.3* 3.7 4.0 4.2  CL  --   < > 105 106 105 106 106  CO2  --   < >  GLUCOSE  --   < > 188* 150* 117* 100* 113*  BUN  --   < > 47* 45* 43* 51* 52*  CREATININE  --   < > 1.58* 1.50* 1.37* 1.70* 1.74*  CALCIUM  --   < > 8.9 8.8* 9.0 9.6 9.4  MG 1.8  --   --   --  1.5*  --   --   < > = values in this interval not  displayed. Liver Function Tests:  Recent Labs Lab 04/24/15 1151  AST 27  ALT 13*  ALKPHOS 70  BILITOT 1.2  PROT 6.3*  ALBUMIN 3.0*   No results for input(s): LIPASE, AMYLASE in the last 168 hours. No results for input(s): AMMONIA in the last 168 hours. CBC:  Recent Labs Lab 04/25/15 0230 04/26/15 0337 04/27/15 0530 04/28/15 0356 04/29/15 0320  WBC 7.4 7.1 6.7 9.0 7.3  HGB 12.6 12.0 12.3 13.3 13.1  HCT 39.6 38.4 39.1 41.8 40.5  MCV 90.2 88.7 88.7 89.1 87.5  PLT 151 146* 137* 172 155   Cardiac Enzymes: No results for input(s): CKTOTAL, CKMB, CKMBINDEX, TROPONINI in the last 168 hours. BNP (last 3 results)  Recent Labs  04/21/15 1939 04/29/15 0320  BNP >4500.0* >4500.0*    ProBNP (last 3 results) No results for input(s): PROBNP in the last 8760 hours.  CBG:  Recent Labs Lab 04/29/15 0654 04/29/15 1154 04/29/15 1703 04/29/15 2248 04/30/15 0610  GLUCAP 101* 122* 151* 119* 99    Recent Results (from the past 240 hour(s))  MRSA PCR Screening     Status: None   Collection Time: 04/21/15 10:55 PM  Result Value Ref Range Status   MRSA by PCR NEGATIVE NEGATIVE Final    Comment:        The GeneXpert MRSA Assay (FDA approved for NASAL specimens only), is one component of a comprehensive MRSA colonization surveillance program. It is not intended to diagnose MRSA infection nor to guide or monitor treatment for MRSA infections.      Studies: Dg Chest Port 1 View  04/29/2015  CLINICAL DATA:  Right PICC line placement EXAM: PORTABLE CHEST 1 VIEW COMPARISON:  04/21/2015 FINDINGS: Cardiomegaly again noted. Status post median sternotomy. Atherosclerotic calcifications of thoracic aorta again noted. Again noted left basilar atelectasis. There is right arm PICC line with tip in right atrium. No pneumothorax. For distal SVC position the PICC line should be retracted about 2 cm. No infiltrate or pulmonary edema. IMPRESSION: Cardiomegaly again noted. No segmental  infiltrate or pulmonary edema.There is right arm PICC line with tip in right atrium. No pneumothorax. For distal SVC position the PICC line should be retracted about 2 cm. Electronically Signed   By: Natasha Mead M.D.   On: 04/29/2015 14:36   Dg Hip Unilat With Pelvis 2-3 Views Right  04/28/2015  CLINICAL DATA:  82 year old female who fell on steps this morning. Posterior right hip pain. Initial encounter. EXAM: DG HIP (WITH OR WITHOUT PELVIS) 2-3V RIGHT COMPARISON:  None. FINDINGS: Osteopenia. Extensive aortic, iliac, and femoral artery calcified atherosclerosis. Femoral heads are normally located. No pelvic fracture identified. Proximal left femur appears grossly intact. Proximal right femur appears intact. SI joints within normal limits. IMPRESSION: No acute fracture or dislocation identified about the right hip per pelvis. If occult hip fracture is suspected or if the patient is unable to weightbear, MRI is the preferred modality for further evaluation. Electronically Signed   By: Althea Grimmer.D.  On: 04/28/2015 12:14    Scheduled Meds: . aspirin EC  81 mg Oral Daily  . citalopram  10 mg Oral Daily  . enoxaparin (LOVENOX) injection  30 mg Subcutaneous Q24H  . ferrous sulfate  325 mg Oral BID  . furosemide  80 mg Intravenous BID  . hydrocortisone   Rectal TID  . insulin aspart  0-15 Units Subcutaneous TID WC  . insulin glargine  12 Units Subcutaneous q morning - 10a  . rosuvastatin  5 mg Oral QPM   Continuous Infusions: . milrinone 0.25 mcg/kg/min (04/29/15 2041)      Time spent: 25 minutes    Josiane Labine  [facility] Pager [phone]. If 7PM-7AM, please contact night-coverage at [email or web address], password [facility] 04/30/2015, 9:48 AM  LOS: 9 days

## 2015-04-30 NOTE — Consult Note (Signed)
Consultation Note Date: 04/30/2015   Patient Name: Angela Stuart  DOB: 04-13-1931  MRN: [id number]  Age / Sex: 82 y.o., female  PCP: Estill Dooms, MD Referring Physician: Louellen Molder, MD  Reason for Consultation: Establishing goals of care  Clinical Assessment/Narrative: Angela Stuart is a 82 y.o. female with h/o CHF, CABG, DM, HTN. Who presented to the ED with several week history of worsening peripheral edema and generalized weakness and fatigue to the point where she could not walk per family. She recently moved to area from [place] and last hospitalization there was in December. Symptoms were not being helped by her home Lasix which she takes once a day at noon. She had a high risk stress test but has declined And opted for medical management only.  Heart failure team consulted after she was found to have ReDS reading of 39.  Milrinone added and she has continued to diurese.  Palliative consulted to discuss goals of care.  Contacts/Participants in Discussion: Patient, her son, and her daughter in law Primary Decision Maker: patient   Relationship to Patient self  SUMMARY OF RECOMMENDATIONS  I met with Angela Stuart, her son, and her daughter in Sports coach.  They report understanding that she has significantly decreased heart function and this is not a "fixable" problem.  They want to continue to see how she does moving forward with continued diuresis.  We reviewed a MOST form and discussed how to develop plan of care to focus on continuing therapies that would maximize chance of being well enough to return home and limiting therapies not in line with this goal.  I discussed with patient regarding heroic interventions at the end-of-life. She confirmed that she is DNR but would like to continue with all therapies that are likely to enable her to live as long as possible while feeing as well as possible.  She would  not want CPR at the end of life, nor would she ever want intubation/mechanical ventilation.   We completed MOST form today. DNR, Limited additional interventions (avoid ICU level care), IVF and ABX if indicated, no feeding tube.  She is in agreement that a good plan would be to continue with current therapy and pursue placement for rehab as they have previously been discussing. She has done well with rehabbing in the past. If she does well and continues to thrive following improved medical management, I encouraged they continue with this plan. If, however, she has quick, repeated exacerbations or continues to decline, I recommended that she speak with her cardiologist as an OP to help determine if she is still benefitting from hospital level care or if it may serve her better to shift focus to remaining in her home with focus of being comfortable there.  Her goal is to maximize her medical status and reevaluate her overall condition over the next several weeks.  Discussed with Dr. Raliegh Ip, Dr. Haroldine Laws, and Dr. Clementeen Graham  Code Status/Advance Care Planning: DNR    Code Status Orders        Start     Ordered   04/29/15 1208  Do not attempt resuscitation (DNR)   Continuous    Question Answer Comment  In the event of cardiac or respiratory ARREST Do not call a "code blue"   In the event of cardiac or respiratory ARREST Do not perform Intubation, CPR, defibrillation or ACLS   In the event of cardiac or respiratory ARREST Use medication by any route, position, wound care, and other measures to relive  pain and suffering. May use oxygen, suction and manual treatment of airway obstruction as needed for comfort.      04/29/15 1208    Code Status History    Date Active Date Inactive Code Status Order ID Comments User Context   04/21/2015  9:19 PM 04/29/2015 12:08 PM Full Code [id number]  Etta Quill, DO ED    Advance Directive Documentation        Most Recent Value   Type of Advance Directive  Living  will   Pre-existing out of facility DNR order (yellow form or pink MOST form)     "MOST" Form in Place?        Other Directives:MOST Form  Palliative Prophylaxis:   Bowel Regimen and Delirium Protocol  Additional Recommendations (Limitations, Scope, Preferences): We completed MOST form today. DNR, Limited additional interventions, IVF and ABX if indicated, no feeding tube.  Psycho-social/Spiritual:  Support System: [place] Desire for further Chaplaincy support: No Additional Recommendations: Caregiving  Support/Resources  Prognosis: Unable to determine  Discharge Planning: Skilled facility for rehabilitation   Chief Complaint/ Primary Diagnoses: Present on Admission:  **None**  I have reviewed the medical record, interviewed the patient and family, and examined the patient. The following aspects are pertinent.  Past Medical History  Diagnosis Date  . CHF (congestive heart failure) ([place])   . Hypertension   . Coronary artery disease   . Hyperlipidemia   . Myocardial infarction ([place]) 1970s X1  . Type II diabetes mellitus ([place])   . Migraine     "stopped when I had hysterectomy"  . Fibromyalgia     "feet and arms" (04/22/2015)  . Anxiety   . Depression   . Fluid retention   . Low blood pressure   . Anemia    Social History   Social History  . Marital Status: Widowed    Spouse Name: N/A  . Number of Children: N/A  . Years of Education: N/A   Social History Main Topics  . Smoking status: Never Smoker   . Smokeless tobacco: Never Used  . Alcohol Use: No  . Drug Use: No  . Sexual Activity: No   Other Topics Concern  . None   Social History Narrative   Diet:  ?   Do you drink/eat things with caffeine?  Yes - coffee   Marital status:  Widowed.  What year were you married?  1957   Do you live in a house, apartment, assisted living, condo, trailer, etc.)?  Independent living apt   Is it one or more stories?  3   How many persons live in your home?  1   Do you  have any pets in your home? (please list)  No   Current or past profession:  Teacher   Do you exercise:  No, other than walking to mailbox      Family History  Problem Relation Age of Onset  . Heart failure Father 16    Deceased  . Stroke Mother 32    Deceased  . Skin cancer Sister 8    Living  . Alzheimer's disease Sister 65    Living  . Skin cancer Brother 51    Living  . Diabetes Mellitus II Son 11    Living  . Heart Problems Daughter 23    Living  . Cancer    . Arthritis Mother    Scheduled Meds: . aspirin EC  81 mg Oral Daily  . citalopram  10 mg Oral Daily  .  enoxaparin (LOVENOX) injection  30 mg Subcutaneous Q24H  . ferrous sulfate  325 mg Oral BID  . furosemide  80 mg Intravenous BID  . hydrocortisone   Rectal TID  . insulin aspart  0-15 Units Subcutaneous TID WC  . insulin glargine  12 Units Subcutaneous q morning - 10a  . isosorbide-hydrALAZINE  0.5 tablet Oral TID  . [START ON 05/01/2015] potassium chloride  40 mEq Oral Daily  . rosuvastatin  5 mg Oral QPM   Continuous Infusions: . milrinone 0.25 mcg/kg/min (04/30/15 1035)   PRN Meds:.sodium chloride, acetaminophen, clonazePAM, ondansetron (ZOFRAN) IV, oxyCODONE-acetaminophen, polyvinyl alcohol, promethazine, sodium chloride, sodium chloride Medications Prior to Admission:  Prior to Admission medications   Medication Sig Start Date End Date Taking? Authorizing Provider  aspirin EC 81 MG tablet Take 81 mg by mouth daily.   Yes Historical Provider, MD  bumetanide (BUMEX) 1 MG tablet Take 1 mg by mouth once a week.    Yes Historical Provider, MD  carvedilol (COREG CR) 10 MG 24 hr capsule Take 10 mg by mouth daily.   Yes Historical Provider, MD  citalopram (CELEXA) 10 MG tablet Take 10 mg by mouth daily.   Yes Historical Provider, MD  ferrous sulfate 325 (65 FE) MG EC tablet Take 325 mg by mouth 2 (two) times daily.   Yes Historical Provider, MD  metFORMIN (GLUCOPHAGE) 500 MG tablet Take 500 mg by mouth 2 (two)  times daily with a meal.   Yes Historical Provider, MD  rosuvastatin (CRESTOR) 10 MG tablet Take 5 mg by mouth every evening.    Yes Historical Provider, MD  acetaminophen (TYLENOL) 325 MG tablet Take 650 mg by mouth every 4 (four) hours as needed.    Historical Provider, MD   Allergies  Allergen Reactions  . Codeine Nausea And Vomiting and Other (See Comments)    headaches  . Lipitor [Atorvastatin] Nausea And Vomiting and Other (See Comments)    headaches    Review of Systems  Constitutional: Positive for activity change, appetite change and fatigue.  Respiratory: Positive for shortness of breath. Negative for chest tightness.   Cardiovascular: Positive for leg swelling.  Gastrointestinal: Positive for constipation and abdominal distention. Negative for vomiting and abdominal pain.  Genitourinary: Positive for frequency. Negative for dysuria and difficulty urinating.  Musculoskeletal: Positive for back pain.  Skin: Positive for color change and rash.  Neurological: Negative for syncope.  Psychiatric/Behavioral: Negative for confusion.    Physical Exam  General: Chronically ill and elderly appearing. NAD HEENT: normal Neck: supple. JVP to jaw CV: RRR, SEM noted Lungs: Crackles in bases, normal effort.  Abdomen: soft, NT, mild/moderately distended, no HSM. No bruits or masses. +BS Extremities: 2+ edema Neuro: alert & oriented, no gross deficits Vital Signs: BP 113/60 mmHg  Pulse 87  Temp(Src) 97.8 F (36.6 C) (Oral)  Resp 18  Ht '5\' 6"'  (1.676 m)  Wt 74.1 kg (163 lb 5.8 oz)  BMI 26.38 kg/m2  SpO2 95%  SpO2: SpO2: 95 % O2 Device:SpO2: 95 % O2 Flow Rate: .   IO: Intake/output summary:  Intake/Output Summary (Last 24 hours) at 04/30/15 2016 Last data filed at 04/30/15 1500  Gross per 24 hour  Intake 496.45 ml  Output   2400 ml  Net -1903.55 ml    LBM: Last BM Date: 04/28/15 Baseline Weight: Weight: 77.973 kg (171 lb 14.4 oz) Most recent weight: Weight: 74.1 kg  (163 lb 5.8 oz) (scale a)      Palliative Assessment/Data:  Flowsheet Rows        Most Recent Value   Intake Tab    Referral Department  Cardiology   Unit at Time of Referral  Cardiac/Telemetry Unit   Palliative Care Primary Diagnosis  Cardiac   Date Notified  04/29/15   Palliative Care Type  New Palliative care   Reason for referral  Clarify Goals of Care   Date of Admission  04/21/15   Date first seen by Palliative Care  04/30/15   # of days Palliative referral response time  1 Day(s)   # of days IP prior to Palliative referral  8   Clinical Assessment    Palliative Performance Scale Score  50%   Pain Max last 24 hours  6   Pain Min Last 24 hours  0   Psychosocial & Spiritual Assessment    Palliative Care Outcomes    Patient/Family meeting held?  Yes   Who was at the meeting?  Patient, her son, and her daughter-in-law   Palliative Care Outcomes  Clarified goals of care, Completed durable DNR   Patient/Family wishes: Interventions discontinued/not started   Mechanical Ventilation, BiPAP      Additional Data Reviewed:  CBC:    Component Value Date/Time   WBC 7.3 04/29/2015 0320   HGB 13.1 04/29/2015 0320   HCT 40.5 04/29/2015 0320   PLT 155 04/29/2015 0320   MCV 87.5 04/29/2015 0320   NEUTROABS 5.5 04/21/2015 1938   LYMPHSABS 1.5 04/21/2015 1938   MONOABS 0.5 04/21/2015 1938   EOSABS 0.2 04/21/2015 1938   BASOSABS 0.0 04/21/2015 1938   Comprehensive Metabolic Panel:    Component Value Date/Time   NA 140 04/30/2015 1200   K 3.1* 04/30/2015 1200   CL 104 04/30/2015 1200   CO2 26 04/30/2015 1200   BUN 47* 04/30/2015 1200   CREATININE 1.41* 04/30/2015 1200   GLUCOSE 182* 04/30/2015 1200   CALCIUM 9.3 04/30/2015 1200   AST 27 04/24/2015 1151   ALT 13* 04/24/2015 1151   ALKPHOS 70 04/24/2015 1151   BILITOT 1.2 04/24/2015 1151   PROT 6.3* 04/24/2015 1151   ALBUMIN 3.0* 04/24/2015 1151     Time In: 1630 Time Out: 1750 Time Total: 80 Greater than 50%  of  this time was spent counseling and coordinating care related to the above assessment and plan.  Signed by: Micheline Rough, MD  Micheline Rough, MD  04/30/2015, 8:16 PM  Please contact Palliative Medicine Team phone at [phone] for questions and concerns.

## 2015-04-30 NOTE — Progress Notes (Signed)
Ref: GREEN, Lenon Curt, MD   Subjective:  Slow and steady diuresis with milrinone and IV lasix. Improving breathing. Leg edema down to 1 +.  Objective:  Vital Signs in the last 24 hours: Temp:  [97.4 F (36.3 C)-97.8 F (36.6 C)] 97.5 F (36.4 C) (01/20 0420) Pulse Rate:  [71-85] 71 (01/20 0420) Cardiac Rhythm:  [-] Normal sinus rhythm (01/19 2030) Resp:  [18] 18 (01/20 0420) BP: (114-135)/(58-68) 131/67 mmHg (01/20 0420) SpO2:  [96 %-100 %] 96 % (01/20 0420) Weight:  [74.1 kg (163 lb 5.8 oz)] 74.1 kg (163 lb 5.8 oz) (01/20 0420)  Physical Exam: BP Readings from Last 1 Encounters:  04/30/15 131/67    Wt Readings from Last 1 Encounters:  04/30/15 74.1 kg (163 lb 5.8 oz)    Weight change: 0.526 kg (1 lb 2.6 oz)  HEENT: [place]/AT, Eyes-Brown, PERL, EOMI, Conjunctiva-Pink, Sclera-Non-icteric Neck: No JVD, No bruit, Trachea midline. Lungs:  Clearing, Bilateral. Cardiac:  Regular rhythm, normal S1 and S2, no S3. II/VI systolic murmur. Abdomen:  Soft, non-tender. Extremities:  1 + edema present. No cyanosis. No clubbing. CNS: AxOx3, Cranial nerves grossly intact, moves all 4 extremities. Right handed. Skin: Warm and dry.   Intake/Output from previous day: 01/19 0701 - 01/20 0700 In: 277.1 [P.O.:240; I.V.:37.1] Out: 1000 [Urine:1000]    Lab Results: BMET    Component Value Date/Time   NA 140 04/29/2015 0320   NA 138 04/28/2015 0356   NA 137 04/27/2015 0530   K 4.2 04/29/2015 0320   K 4.0 04/28/2015 0356   K 3.7 04/27/2015 0530   CL 106 04/29/2015 0320   CL 106 04/28/2015 0356   CL 105 04/27/2015 0530   CO2 24 04/29/2015 0320   CO2 22 04/28/2015 0356   CO2 23 04/27/2015 0530   GLUCOSE 113* 04/29/2015 0320   GLUCOSE 100* 04/28/2015 0356   GLUCOSE 117* 04/27/2015 0530   BUN 52* 04/29/2015 0320   BUN 51* 04/28/2015 0356   BUN 43* 04/27/2015 0530   CREATININE 1.74* 04/29/2015 0320   CREATININE 1.70* 04/28/2015 0356   CREATININE 1.37* 04/27/2015 0530   CALCIUM 9.4  04/29/2015 0320   CALCIUM 9.6 04/28/2015 0356   CALCIUM 9.0 04/27/2015 0530   GFRNONAA 26* 04/29/2015 0320   GFRNONAA 27* 04/28/2015 0356   GFRNONAA 35* 04/27/2015 0530   GFRAA 30* 04/29/2015 0320   GFRAA 31* 04/28/2015 0356   GFRAA 41* 04/27/2015 0530   CBC    Component Value Date/Time   WBC 7.3 04/29/2015 0320   RBC 4.63 04/29/2015 0320   HGB 13.1 04/29/2015 0320   HCT 40.5 04/29/2015 0320   PLT 155 04/29/2015 0320   MCV 87.5 04/29/2015 0320   MCH 28.3 04/29/2015 0320   MCHC 32.3 04/29/2015 0320   RDW 15.4 04/29/2015 0320   LYMPHSABS 1.5 04/21/2015 1938   MONOABS 0.5 04/21/2015 1938   EOSABS 0.2 04/21/2015 1938   BASOSABS 0.0 04/21/2015 1938   HEPATIC Function Panel  Recent Labs  04/24/15 1151  PROT 6.3*   HEMOGLOBIN A1C No components found for: HGA1C,  MPG CARDIAC ENZYMES Lab Results  Component Value Date   TROPONINI 0.13* 04/22/2015   TROPONINI 0.15* 04/22/2015   TROPONINI 0.16* 04/21/2015   BNP No results for input(s): PROBNP in the last 8760 hours. TSH  Recent Labs  04/24/15 1151  TSH 5.028*   CHOLESTEROL No results for input(s): CHOL in the last 8760 hours.  Scheduled Meds: . aspirin EC  81 mg Oral Daily  .  citalopram  10 mg Oral Daily  . enoxaparin (LOVENOX) injection  30 mg Subcutaneous Q24H  . ferrous sulfate  325 mg Oral BID  . furosemide  80 mg Intravenous BID  . hydrocortisone   Rectal TID  . insulin aspart  0-15 Units Subcutaneous TID WC  . insulin glargine  12 Units Subcutaneous q morning - 10a  . lisinopril  2.5 mg Oral Daily  . rosuvastatin  5 mg Oral QPM   Continuous Infusions: . milrinone 0.25 mcg/kg/min (04/29/15 2041)   PRN Meds:.sodium chloride, acetaminophen, clonazePAM, ondansetron (ZOFRAN) IV, oxyCODONE-acetaminophen, polyvinyl alcohol, promethazine, sodium chloride, sodium chloride  Assessment/Plan: Acute on chronic left heart systolic failure Ischemic cardiomyopathy Acute on chronic renal failure Abnormal  troponin-I from renal insufficiency or demand ischemia DM, II Dyslipidemia Depression Hypothyroidism unlikely with elevated free T 4 and borderline TSH elevation in sick patient. Hypoalbuminemia  Continue diuresis. Check EKG.   LOS: 9 days    Orpah Cobb  MD  04/30/2015, 9:04 AM

## 2015-05-01 DIAGNOSIS — N289 Disorder of kidney and ureter, unspecified: Secondary | ICD-10-CM

## 2015-05-01 DIAGNOSIS — I509 Heart failure, unspecified: Secondary | ICD-10-CM

## 2015-05-01 LAB — CARBOXYHEMOGLOBIN
Carboxyhemoglobin: 1.7 % — ABNORMAL HIGH (ref 0.5–1.5)
METHEMOGLOBIN: 0.8 % (ref 0.0–1.5)
O2 SAT: 61.7 %
TOTAL HEMOGLOBIN: 11.7 g/dL — AB (ref 12.0–16.0)

## 2015-05-01 LAB — RENAL FUNCTION PANEL
ANION GAP: 9 (ref 5–15)
Albumin: 2.8 g/dL — ABNORMAL LOW (ref 3.5–5.0)
BUN: 41 mg/dL — ABNORMAL HIGH (ref 6–20)
CHLORIDE: 105 mmol/L (ref 101–111)
CO2: 28 mmol/L (ref 22–32)
Calcium: 9.4 mg/dL (ref 8.9–10.3)
Creatinine, Ser: 1.2 mg/dL — ABNORMAL HIGH (ref 0.44–1.00)
GFR calc non Af Amer: 41 mL/min — ABNORMAL LOW (ref >60)
GFR, EST AFRICAN AMERICAN: 48 mL/min — AB (ref >60)
GLUCOSE: 151 mg/dL — AB (ref 65–99)
Phosphorus: 2.8 mg/dL (ref 2.5–4.6)
Potassium: 3.3 mmol/L — ABNORMAL LOW (ref 3.5–5.1)
Sodium: 142 mmol/L (ref 135–145)

## 2015-05-01 LAB — GLUCOSE, CAPILLARY
GLUCOSE-CAPILLARY: 134 mg/dL — AB (ref 65–99)
GLUCOSE-CAPILLARY: 125 mg/dL — AB (ref 65–99)
GLUCOSE-CAPILLARY: 252 mg/dL — AB (ref 65–99)
GLUCOSE-CAPILLARY: 188 mg/dL — AB (ref 65–99)

## 2015-05-01 LAB — URIC ACID: Uric Acid, Serum: 11.1 mg/dL — ABNORMAL HIGH (ref 2.3–6.6)

## 2015-05-01 MED ORDER — FUROSEMIDE 10 MG/ML IJ SOLN
30.0000 mg/h | INTRAMUSCULAR | Status: DC
  Administered 2015-05-01: 12 mg/h via INTRAVENOUS
  Administered 2015-05-02 – 2015-05-03 (×3): 15 mg/h via INTRAVENOUS
  Administered 2015-05-04 – 2015-05-05 (×2): 30 mg/h via INTRAVENOUS
  Filled 2015-05-01 (×13): qty 25

## 2015-05-01 MED ORDER — POTASSIUM CHLORIDE CRYS ER 20 MEQ PO TBCR
40.0000 meq | EXTENDED_RELEASE_TABLET | Freq: Two times a day (BID) | ORAL | Status: DC
  Administered 2015-05-01 – 2015-05-04 (×5): 40 meq via ORAL
  Filled 2015-05-01 (×7): qty 2

## 2015-05-01 MED ORDER — METOLAZONE 2.5 MG PO TABS
2.5000 mg | ORAL_TABLET | Freq: Once | ORAL | Status: AC
  Administered 2015-05-01: 2.5 mg via ORAL
  Filled 2015-05-01: qty 1

## 2015-05-01 MED ORDER — POTASSIUM CHLORIDE CRYS ER 20 MEQ PO TBCR
40.0000 meq | EXTENDED_RELEASE_TABLET | Freq: Once | ORAL | Status: AC
  Administered 2015-05-01: 40 meq via ORAL
  Filled 2015-05-01: qty 2

## 2015-05-01 NOTE — Progress Notes (Signed)
Subjective:  Patient denies any chest pain states breathing has improved. Remains on low-dose milrinone. Renal function improving. Appreciate heart failure team consult and help. Objective:  Vital Signs in the last 24 hours: Temp:  [97.9 F (36.6 C)-98.6 F (37 C)] 97.9 F (36.6 C) (01/21 0445) Pulse Rate:  [72-95] 95 (01/21 1118) Resp:  [18-20] 18 (01/21 1118) BP: (102-111)/(51-55) 102/51 mmHg (01/21 1118) SpO2:  [95 %-100 %] 100 % (01/21 1118) Weight:  [70.126 kg (154 lb 9.6 oz)-71.26 kg (157 lb 1.6 oz)] 71.26 kg (157 lb 1.6 oz) (01/21 1101)  Intake/Output from previous day: 01/20 0701 - 01/21 0700 In: 696.1 [P.O.:540; I.V.:156.1] Out: 1750 [Urine:1750] Intake/Output from this shift: Total I/O In: 240 [P.O.:240] Out: 300 [Urine:300]  Physical Exam: Neck: no adenopathy, no carotid bruit, no JVD and supple, symmetrical, trachea midline Lungs: Decreased breath sound at bases with faint rales Heart: regular rate and rhythm, S1, S2 normal and 2/6 systolic murmur and S3 gallop noted Abdomen: soft, non-tender; bowel sounds normal; no masses,  no organomegaly Extremities: 2+ edema noted left more than right  Lab Results:  Recent Labs  04/29/15 0320  WBC 7.3  HGB 13.1  PLT 155    Recent Labs  04/30/15 1200 05/01/15 0450  NA 140 142  K 3.1* 3.3*  CL 104 105  CO2 26 28  GLUCOSE 182* 151*  BUN 47* 41*  CREATININE 1.41* 1.20*   No results for input(s): TROPONINI in the last 72 hours.  Invalid input(s): CK, MB Hepatic Function Panel  Recent Labs  05/01/15 0450  ALBUMIN 2.8*   No results for input(s): CHOL in the last 72 hours. No results for input(s): PROTIME in the last 72 hours.  Imaging: Imaging results have been reviewed and Dg Chest Port 1 View  04/29/2015  CLINICAL DATA:  Right PICC line placement EXAM: PORTABLE CHEST 1 VIEW COMPARISON:  04/21/2015 FINDINGS: Cardiomegaly again noted. Status post median sternotomy. Atherosclerotic calcifications of  thoracic aorta again noted. Again noted left basilar atelectasis. There is right arm PICC line with tip in right atrium. No pneumothorax. For distal SVC position the PICC line should be retracted about 2 cm. No infiltrate or pulmonary edema. IMPRESSION: Cardiomegaly again noted. No segmental infiltrate or pulmonary edema.There is right arm PICC line with tip in right atrium. No pneumothorax. For distal SVC position the PICC line should be retracted about 2 cm. Electronically Signed   By: Natasha Mead M.D.   On: 04/29/2015 14:36    Cardiac Studies:  Assessment/Plan:  Resolving acute on chronic systolic heart failure Ischemic artery myopathy CAD history of MI in the past status post CABG Hypertension Diabetes mellitus Hypothyroidism Hyperlipidemia Cardiorenal syndrome Plan Continue present management Discussed with patient regarding left cardiac cath possible PTCA stenting states will discuss with her son. States had problem with bleeding post cardiac catheterization many years ago.  LOS: 10 days    Rinaldo Cloud 05/01/2015, 12:15 PM

## 2015-05-01 NOTE — Progress Notes (Signed)
Patient ID: Angela Stuart, female   DOB: Oct 25, 1931, 82 y.o.   MRN: [id number]  Advanced Heart Failure Rounding Note  PCP: None per pt. Recently moved to [place] from [place].   Subjective:    Weight down about 6 lbs.  Suspect I/Os not accurate but she does not think she urinated a lot.  No dyspnea. Creatinine lower today.   Objective:   Weight Range: 157 lb 1.6 oz (71.26 kg) Body mass index is 25.37 kg/(m^2).   Vital Signs:   Temp:  [97.9 F (36.6 C)-98.6 F (37 C)] 97.9 F (36.6 C) (01/21 0445) Pulse Rate:  [72-77] 77 (01/21 0445) Resp:  [18-20] 20 (01/21 0445) BP: (109-111)/(52-55) 109/52 mmHg (01/21 0445) SpO2:  [95 %] 95 % (01/21 0445) Weight:  [154 lb 9.6 oz (70.126 kg)-157 lb 1.6 oz (71.26 kg)] 157 lb 1.6 oz (71.26 kg) (01/21 1101) Last BM Date: 04/28/15  Weight change: Filed Weights   04/30/15 0420 05/01/15 0445 05/01/15 1101  Weight: 163 lb 5.8 oz (74.1 kg) 154 lb 9.6 oz (70.126 kg) 157 lb 1.6 oz (71.26 kg)    Intake/Output:   Intake/Output Summary (Last 24 hours) at 05/01/15 1103 Last data filed at 05/01/15 0800  Gross per 24 hour  Intake 466.79 ml  Output   1600 ml  Net -1133.21 ml     Physical Exam: General: Chronically ill and elderly appearing. NAD HEENT: normal Neck: supple. JVP to jaw. Carotids 2+ bilat; no bruits. No thyromegaly or lymphadenopathy noted Cor: PMI nondisplaced. RRR, 3/6 TR, 2/6 MR murmur. +S3 Lungs: Slight basilar crackles, normal effort.  Abdomen: soft, NT, mild/moderately distended, no HSM. No bruits or masses. +BS Extremities: no cyanosis, clubbing, rash, 2-3+ edema, 1+ into thighs, extremities more warm. Neuro: alert & orientedx3, cranial nerves grossly intact. moves all 4 extremities w/o difficulty. Affect pleasant  Telemetry: Reviewed personally, NSR 80s with occasional PVCs.   Labs: CBC  Recent Labs  04/29/15 0320  WBC 7.3  HGB 13.1  HCT 40.5  MCV 87.5  PLT 155   Basic Metabolic Panel  Recent Labs  04/30/15 1200  05/01/15 0450  NA 140 142  K 3.1* 3.3*  CL 104 105  CO2 26 28  GLUCOSE 182* 151*  BUN 47* 41*  CREATININE 1.41* 1.20*  CALCIUM 9.3 9.4  PHOS  --  2.8   Liver Function Tests  Recent Labs  05/01/15 0450  ALBUMIN 2.8*   No results for input(s): LIPASE, AMYLASE in the last 72 hours. Cardiac Enzymes No results for input(s): CKTOTAL, CKMB, CKMBINDEX, TROPONINI in the last 72 hours.  BNP: BNP (last 3 results)  Recent Labs  04/21/15 1939 04/29/15 0320  BNP >4500.0* >4500.0*    ProBNP (last 3 results) No results for input(s): PROBNP in the last 8760 hours.   D-Dimer No results for input(s): DDIMER in the last 72 hours. Hemoglobin A1C No results for input(s): HGBA1C in the last 72 hours. Fasting Lipid Panel No results for input(s): CHOL, HDL, LDLCALC, TRIG, CHOLHDL, LDLDIRECT in the last 72 hours. Thyroid Function Tests No results for input(s): TSH, T4TOTAL, T3FREE, THYROIDAB in the last 72 hours.  Invalid input(s): FREET3  Other results:     Imaging/Studies:  Dg Chest Port 1 View  04/29/2015  CLINICAL DATA:  Right PICC line placement EXAM: PORTABLE CHEST 1 VIEW COMPARISON:  04/21/2015 FINDINGS: Cardiomegaly again noted. Status post median sternotomy. Atherosclerotic calcifications of thoracic aorta again noted. Again noted left basilar atelectasis. There is right arm PICC line with  tip in right atrium. No pneumothorax. For distal SVC position the PICC line should be retracted about 2 cm. No infiltrate or pulmonary edema. IMPRESSION: Cardiomegaly again noted. No segmental infiltrate or pulmonary edema.There is right arm PICC line with tip in right atrium. No pneumothorax. For distal SVC position the PICC line should be retracted about 2 cm. Electronically Signed   By: Natasha Mead M.D.   On: 04/29/2015 14:36    Latest Echo  Latest Cath   Medications:     Scheduled Medications: . aspirin EC  81 mg Oral Daily  . citalopram  10 mg Oral Daily  . enoxaparin  (LOVENOX) injection  30 mg Subcutaneous Q24H  . ferrous sulfate  325 mg Oral BID  . furosemide  80 mg Intravenous BID  . hydrocortisone   Rectal TID  . insulin aspart  0-15 Units Subcutaneous TID WC  . insulin glargine  12 Units Subcutaneous q morning - 10a  . isosorbide-hydrALAZINE  0.5 tablet Oral TID  . metolazone  2.5 mg Oral Once  . potassium chloride  40 mEq Oral BID  . rosuvastatin  5 mg Oral QPM    Infusions: . furosemide (LASIX) infusion    . milrinone 0.25 mcg/kg/min (05/01/15 0642)    PRN Medications: sodium chloride, acetaminophen, clonazePAM, ondansetron (ZOFRAN) IV, oxyCODONE-acetaminophen, polyvinyl alcohol, promethazine, sodium chloride, sodium chloride   Assessment   1. A/C systolic HF: echo 04/23/15 initially read as 35-40%, looks more like 20-25%. Myoview 04/27/15 EF 21%.  She has low output  2. CAD s/p CABG x 4 1971: Small area of anterior ischemia only on Cardiolite.  3. AKI on CKD stage III 4. Endocrine 5. S/p Fall 04/28/15 6. DNR/DNI   Plan   Coox 62% this am on milrinone 0.25. She is still very volume overloaded on exam. - Continue IV Lasix, will give as infusion at 12 mg/hr.  Will also give another dose metolazone 2.5 x 1 today and replace K.  - Continue Bidil 1/2 tab tid.  ACEI held with increased creatinine.   Severe MR on echo, likely functional.   Length of Stay: 10  Marca Ancona 05/01/2015, 11:03 AM  Advanced Heart Failure Team Pager [phone] (M-F; 7a - 4p)  Please contact [facility] for night-coverage after hours (4p -7a ) and weekends on [email or web address]

## 2015-05-01 NOTE — Clinical Social Work Note (Signed)
Clinical Social Work Assessment  Patient Details  Name: Angela Stuart MRN: [id number] Date of Birth: 1932-01-07  Date of referral:  04/30/15               Reason for consult:  Facility Placement                Permission sought to share information with:  Family Supports, Oceanographer granted to share information::  Yes, Verbal Permission Granted  Name::     [facility] [facility]'s, Son- [facility] and daughter-in-law Diane  Agency::     Relationship::     Contact Information:     Housing/Transportation Living arrangements for the past 2 months:  Skilled Building surveyor of Information:  Patient Patient Interpreter Needed:  None Criminal Activity/Legal Involvement Pertinent to Current Situation/Hospitalization:  No - Comment as needed Significant Relationships:  Adult Children Lives with:  Self Do you feel safe going back to the place where you live?  Yes (Feels she needs short term rehab) Need for family participation in patient care:  Yes (Comment) (Wants son and his wife invovled in d/c planning.)  Care giving concerns:  "I planned to go home but I'm weaker than I thought. Guess I have to go to rehab for a while"   Office manager / plan: 82 year old female admitted from home where she reports that she was mostly independent of her ADL's. She states that during a therapy session that she was doing stair training- she fell and now she "hurts all over."  Lance Bosch had hoped to return home but now feels she needs SNF.  Discussed bed search process and she indicates that she does not know about any of the facilities.  She states that her son Lowry Ram and daughter-in-law  Sedalia Muta would be able to help her to choose a facility. Fl2 will be initiated and sent to area for facilities to review.  Active bed search will be initiated.  Employment status:  Retired Database administrator PT Recommendations:  Skilled Nursing Facility Information /  Referral to community resources:  Skilled Nursing Facility  Patient/Family's Response to care:  Patient states "I just don't feel good" but indicates that she is being well cared for in the hospital. She denies any concerns about her care at this time.  Patient/Family's Understanding of and Emotional Response to Diagnosis, Current Treatment, and Prognosis:  Patient is able to verbalize a strong understanding of her current diagnosis, prognosis and treatment needs.   Emotional Assessment Appearance:  Appears stated age Attitude/Demeanor/Rapport:   (Coopeative, open and involved, rational) Affect (typically observed):  Pleasant, Quiet, Calm Orientation:  Oriented to Self, Oriented to Place, Oriented to  Time, Oriented to Situation Alcohol / Substance use:  Never Used Psych involvement (Current and /or in the community):     Discharge Needs  Concerns to be addressed:  Care Coordination Readmission within the last 30 days:    Current discharge risk:  Dependent with Mobility (states she fell while working with Physical Therapy) Barriers to Discharge:  Continued Medical Work up   Lovette Cliche T, [place] 05/01/2015, 8:58 PM

## 2015-05-01 NOTE — NC FL2 (Signed)
[place] MEDICAID FL2 LEVEL OF CARE SCREENING TOOL     IDENTIFICATION  Patient Name: Angela Stuart Birthdate: Aug 28, 1931 Sex: female Admission Date (Current Location): 04/21/2015  [facility] and [place] Number:  Producer, television/film/video and Address:  [facility], [address] [place], [place] [address]      Provider Number: [id number]  Attending Physician Name and Address:  Penny Pia, MD  Relative Name and Phone Number:       Current Level of Care: Hospital Recommended Level of Care: Skilled Nursing Facility Prior Approval Number:    Date Approved/Denied: 05/01/15 PASRR Number: [id number]  Discharge Plan: SNF    Current Diagnoses: Patient Active Problem List   Diagnosis Date Noted  . Acute on chronic congestive heart failure (HCC)   . Acute on chronic left systolic heart failure (HCC)   . Acute kidney injury (HCC)   . Hypokalemia   . CHF, acute on chronic (HCC) 04/21/2015  . Renal insufficiency 04/21/2015  . DM2 (diabetes mellitus, type 2) (HCC) 04/21/2015    Orientation RESPIRATION BLADDER Height & Weight    Self, Time, Situation, Place  Normal Continent  (167.6 cm) 157 lbs.  BEHAVIORAL SYMPTOMS/MOOD NEUROLOGICAL BOWEL NUTRITION STATUS      Continent    AMBULATORY STATUS COMMUNICATION OF NEEDS Skin   Extensive Assist Verbally Normal                       Personal Care Assistance Level of Assistance  Bathing, Feeding, Dressing Bathing Assistance: Maximum assistance Feeding assistance: Independent Dressing Assistance: Maximum assistance     Functional Limitations Info  Sight, Hearing, Speech Sight Info: Adequate Hearing Info: Adequate Speech Info: Adequate    SPECIAL CARE FACTORS FREQUENCY  PT (By licensed PT), OT (By licensed OT)                    Contractures      Additional Factors Info  Code Status, Allergies, Insulin Sliding Scale Code Status Info: DNR Allergies Info: Codeine, Lipitor   Insulin  Sliding Scale Info: NovoLOG 0-15 units 3 times daily with meal       Current Medications (05/01/2015):  This is the current hospital active medication list Current Facility-Administered Medications  Medication Dose Route Frequency Provider Last Rate Last Dose  . 0.9 %  sodium chloride infusion  250 mL Intravenous PRN Hillary Bow, DO      . acetaminophen (TYLENOL) tablet 650 mg  650 mg Oral Q4H PRN Hillary Bow, DO   650 mg at 04/28/15 1114  . aspirin EC tablet 81 mg  81 mg Oral Daily Hillary Bow, DO   81 mg at 05/01/15 1023  . citalopram (CELEXA) tablet 10 mg  10 mg Oral Daily Hillary Bow, DO   10 mg at 05/01/15 1024  . clonazePAM (KLONOPIN) tablet 0.25 mg  0.25 mg Oral TID PRN Albertine Grates, MD   0.25 mg at 04/29/15 1245  . enoxaparin (LOVENOX) injection 30 mg  30 mg Subcutaneous Q24H Maryann Mikhail, DO   30 mg at 04/30/15 1809  . ferrous sulfate tablet 325 mg  325 mg Oral BID Hillary Bow, DO   325 mg at 05/01/15 1023  . furosemide (LASIX) 250 mg in dextrose 5 % 250 mL (1 mg/mL) infusion  12 mg/hr Intravenous Continuous Laurey Morale, MD 12 mL/hr at 05/01/15 1215 12 mg/hr at 05/01/15 1215  . hydrocortisone (ANUSOL-HC) 2.5 %  rectal cream   Rectal TID Maryann Mikhail, DO      . insulin aspart (novoLOG) injection 0-15 Units  0-15 Units Subcutaneous TID WC Maryann Mikhail, DO   2 Units at 05/01/15 1344  . insulin glargine (LANTUS) injection 12 Units  12 Units Subcutaneous q morning - 10a Hillary Bow, DO   12 Units at 05/01/15 1024  . isosorbide-hydrALAZINE (BIDIL) 20-37.5 MG per tablet 0.5 tablet  0.5 tablet Oral TID Dolores Patty, MD   0.5 tablet at 05/01/15 1023  . milrinone (PRIMACOR) 20 MG/100ML (0.2 mg/mL) infusion  0.25 mcg/kg/min Intravenous Continuous Graciella Freer, PA-C 5.5 mL/hr at 05/01/15 0642 0.25 mcg/kg/min at 05/01/15 1478  . ondansetron (ZOFRAN) injection 4 mg  4 mg Intravenous Q6H PRN Hillary Bow, DO   4 mg at 04/27/15 0917  .  oxyCODONE-acetaminophen (PERCOCET/ROXICET) 5-325 MG per tablet 1 tablet  1 tablet Oral Q8H PRN Albertine Grates, MD   1 tablet at 04/29/15 1245  . polyvinyl alcohol (LIQUIFILM TEARS) 1.4 % ophthalmic solution 1-2 drop  1-2 drop Right Eye QID PRN Ancil Boozer, RPH   1 drop at 04/24/15 1305  . potassium chloride SA (K-DUR,KLOR-CON) CR tablet 40 mEq  40 mEq Oral BID Laurey Morale, MD      . potassium chloride SA (K-DUR,KLOR-CON) CR tablet 40 mEq  40 mEq Oral Once Laurey Morale, MD      . promethazine [facility]) injection 12.5 mg  12.5 mg Intravenous Q6H PRN Maryann Mikhail, DO   12.5 mg at 04/28/15 0841  . rosuvastatin (CRESTOR) tablet 5 mg  5 mg Oral QPM Maryann Mikhail, DO   5 mg at 04/30/15 1759  . sodium chloride 0.9 % injection 10-40 mL  10-40 mL Intracatheter PRN Albertine Grates, MD   10 mL at 05/01/15 0450  . sodium chloride 0.9 % injection 3 mL  3 mL Intravenous PRN Hillary Bow, DO         Discharge Medications: Please see discharge summary for a list of discharge medications.  Relevant Imaging Results:  Relevant Lab Results:   Additional Information SS#: [id number]  Loleta Dicker, LCSW

## 2015-05-01 NOTE — Progress Notes (Signed)
[facility] PROGRESS NOTE  Angela Stuart ZOX:[id number] DOB: [birth year] DOA: 04/21/2015 PCP: Kimber Relic, MD  Brief narrative 82 year old female with history of severe systolic CHF (EF of 35-40%) with severe mitral regurgitation, diastolic dysfunction, history of CAD status post CABG, type 2 diabetes mellitus and hypertension , recently moved from [place] to live with her family presented with several weeks of generalized weakness with fatigue and increased peripheral edema. Patient was unable to walk on the day of admission and did not improved with her home dose of Lasix. Patient found to be in acute CHF and admitted to hospitalist service. Patient diuresed poorly with IV Lasix and had to be placed on milrinone drip. Cardiology/heart failure team and palliative care consulted.   Assessment/Plan: Acute combined systolic and diastolic CHF exacerbation Echo showing EF of 35 and 40% with severe MR posteriorly and diastolic dysfunction. BNP >4500. On IV Lasix but appears volume overloaded today. Diuresis improved after placing her on milrinone drip. Added metolazone today. Lisinopril discontinued due to worsening acute kidney injury. Added BiDil. NT aspirin and statin. Cardiology on board and managing. Once Cardiology clears for discharge will start disposition planning  Lower extremity venous efficiency  Appears to be chronic as per patient.  No clinical signs of infection. Antibiotic discontinued. Lower extremity Doppler negative for DVT.  Acute versus acute on chronic kidney injury. Possibly associated with diuresis. Renal ultrasound unremarkable. Metformin and ACE inhibitor held. Creatinine improved today.  Type 2 diabetes mellitus A1c of 8.2. Metformin held. Continue home dose Lantus with sliding scale coverage.  - Diabetic coordinator on board  Hypokalemia  Replace and reassess  Depression Continue Celexa  Incidental finding of liver cirrhosis on renal  ultrasound. LFTs normal. Continue to monitor.  Elevated TSH Free T4 of 1.59. Recommend repeat thyroid function once acute illness resolved.  Chronic right hip pain with physical deconditioning No fractures on x-ray PT recommends skilled nursing facility. Palliative care consulted with plan on family meeting today.   DVT prophylaxis: Subcutaneous Lovenox  Diet: Heart healthy/diabetic  Code Status:DNR Family Communication: none at bedside Disposition Plan: Continue inpatient monitoring.   Consultants:  cardiology ( Dr Algie Coffer)  palliative care  Procedures:  2-D echo  Antibiotics:  Doxycycline (stopped)  HPI/Subjective: Has no new complaints and denies feeling sob currently  Objective: Filed Vitals:   05/01/15 0445 05/01/15 1118  BP: 109/52 102/51  Pulse: 77 95  Temp: 97.9 F (36.6 C)   Resp: 20 18    Intake/Output Summary (Last 24 hours) at 05/01/15 1216 Last data filed at 05/01/15 1100  Gross per 24 hour  Intake 706.79 ml  Output   1350 ml  Net -643.21 ml   Filed Weights   04/30/15 0420 05/01/15 0445 05/01/15 1101  Weight: 74.1 kg (163 lb 5.8 oz) 70.126 kg (154 lb 9.6 oz) 71.26 kg (157 lb 1.6 oz)    Exam:   General:  NAD, alert and awake  HEENT: JVD+, moist mucosa  Cardiovascular: loud S1, normal S2, 3/6 Systolic murmur  Respiratory: diminished breath sounds b/l, no wheezes  Abdomen: soft, ND, NT, BS+  Musculoskeletal: warm, erythema b/l leg (mainly in left, chronic), 2+ pitting edema  CNS: alert and oriented  Data Reviewed: Basic Metabolic Panel:  Recent Labs Lab 04/27/15 0530 04/28/15 0356 04/29/15 0320 04/30/15 1200 05/01/15 0450  NA 137 138 140 140 142  K 3.7 4.0 4.2 3.1* 3.3*  CL 105 106 106 104 105  CO2 GLUCOSE 117* 100*  113* 182* 151*  BUN 43* 51* 52* 47* 41*  CREATININE 1.37* 1.70* 1.74* 1.41* 1.20*  CALCIUM 9.0 9.6 9.4 9.3 9.4  MG 1.5*  --   --   --   --   PHOS  --   --   --   --  2.8   Liver  Function Tests:  Recent Labs Lab 05/01/15 0450  ALBUMIN 2.8*   No results for input(s): LIPASE, AMYLASE in the last 168 hours. No results for input(s): AMMONIA in the last 168 hours. CBC:  Recent Labs Lab 04/25/15 0230 04/26/15 0337 04/27/15 0530 04/28/15 0356 04/29/15 0320  WBC 7.4 7.1 6.7 9.0 7.3  HGB 12.6 12.0 12.3 13.3 13.1  HCT 39.6 38.4 39.1 41.8 40.5  MCV 90.2 88.7 88.7 89.1 87.5  PLT 151 146* 137* 172 155   Cardiac Enzymes: No results for input(s): CKTOTAL, CKMB, CKMBINDEX, TROPONINI in the last 168 hours. BNP (last 3 results)  Recent Labs  04/21/15 1939 04/29/15 0320  BNP >4500.0* >4500.0*    ProBNP (last 3 results) No results for input(s): PROBNP in the last 8760 hours.  CBG:  Recent Labs Lab 04/30/15 0610 04/30/15 1109 04/30/15 1723 05/01/15 0641 05/01/15 1116  GLUCAP 99 144* 147* 134* 125*    Recent Results (from the past 240 hour(s))  MRSA PCR Screening     Status: None   Collection Time: 04/21/15 10:55 PM  Result Value Ref Range Status   MRSA by PCR NEGATIVE NEGATIVE Final    Comment:        The GeneXpert MRSA Assay (FDA approved for NASAL specimens only), is one component of a comprehensive MRSA colonization surveillance program. It is not intended to diagnose MRSA infection nor to guide or monitor treatment for MRSA infections.      Studies: Dg Chest Port 1 View  04/29/2015  CLINICAL DATA:  Right PICC line placement EXAM: PORTABLE CHEST 1 VIEW COMPARISON:  04/21/2015 FINDINGS: Cardiomegaly again noted. Status post median sternotomy. Atherosclerotic calcifications of thoracic aorta again noted. Again noted left basilar atelectasis. There is right arm PICC line with tip in right atrium. No pneumothorax. For distal SVC position the PICC line should be retracted about 2 cm. No infiltrate or pulmonary edema. IMPRESSION: Cardiomegaly again noted. No segmental infiltrate or pulmonary edema.There is right arm PICC line with tip in right  atrium. No pneumothorax. For distal SVC position the PICC line should be retracted about 2 cm. Electronically Signed   By: Natasha Mead M.D.   On: 04/29/2015 14:36    Scheduled Meds: . aspirin EC  81 mg Oral Daily  . citalopram  10 mg Oral Daily  . enoxaparin (LOVENOX) injection  30 mg Subcutaneous Q24H  . ferrous sulfate  325 mg Oral BID  . hydrocortisone   Rectal TID  . insulin aspart  0-15 Units Subcutaneous TID WC  . insulin glargine  12 Units Subcutaneous q morning - 10a  . isosorbide-hydrALAZINE  0.5 tablet Oral TID  . metolazone  2.5 mg Oral Once  . potassium chloride  40 mEq Oral BID  . potassium chloride  40 mEq Oral Once  . rosuvastatin  5 mg Oral QPM   Continuous Infusions: . furosemide (LASIX) infusion 12 mg/hr (05/01/15 1215)  . milrinone 0.25 mcg/kg/min (05/01/15 1610)      Time spent: 25 minutes    Lemmie Vanlanen, [facility]  [facility] Pager [phone]. If 7PM-7AM, please contact night-coverage at [email or web address], password [email or web address] 05/01/2015, 12:16 PM  LOS: 10 days

## 2015-05-01 NOTE — Clinical Social Work Placement (Signed)
   CLINICAL SOCIAL WORK PLACEMENT  NOTE  Date:  05/01/2015  Patient Details  Name: Angela Stuart MRN: [id number] Date of Birth: 06/29/1931  Clinical Social Work is seeking post-discharge placement for this patient at the Skilled  Nursing Facility level of care (*CSW will initial, date and re-position this form in  chart as items are completed):  Yes   Patient/family provided with [facility] Clinical Social Work Department's list of facilities offering this level of care within the geographic area requested by the patient (or if unable, by the patient's family).  Yes   Patient/family informed of their freedom to choose among providers that offer the needed level of care, that participate in Medicare, Medicaid or managed care program needed by the patient, have an available bed and are willing to accept the patient.  Yes   Patient/family informed of [facility]'s ownership interest in [facility] and [facility], as well as of the fact that they are under no obligation to receive care at these facilities.  PASRR submitted to EDS on 05/01/15     PASRR number received on 05/01/15     Existing PASRR number confirmed on       FL2 transmitted to all facilities in geographic area requested by pt/family on 05/01/15     FL2 transmitted to all facilities within larger geographic area on       Patient informed that his/her managed care company has contracts with or will negotiate with certain facilities, including the following:   (Blue Medicare- auth required for admissiont to SNF.)         Patient/family informed of bed offers received.  Patient chooses bed at       Physician recommends and patient chooses bed at      Patient to be transferred to   on  .  Patient to be transferred to facility by Ambulance (PTAR     Patient family notified on   of transfer.  Name of family member notified:        PHYSICIAN       Additional Comment:     _______________________________________________ Darylene Price, LCSW 05/01/2015, 8:30 PM

## 2015-05-02 LAB — GLUCOSE, CAPILLARY
GLUCOSE-CAPILLARY: 180 mg/dL — AB (ref 65–99)
GLUCOSE-CAPILLARY: 276 mg/dL — AB (ref 65–99)
GLUCOSE-CAPILLARY: 256 mg/dL — AB (ref 65–99)
Glucose-Capillary: 253 mg/dL — ABNORMAL HIGH (ref 65–99)

## 2015-05-02 LAB — CBC
HCT: 36.8 % (ref 36.0–46.0)
HEMOGLOBIN: 11.8 g/dL — AB (ref 12.0–15.0)
MCH: 28.3 pg (ref 26.0–34.0)
MCHC: 32.1 g/dL (ref 30.0–36.0)
MCV: 88.2 fL (ref 78.0–100.0)
PLATELETS: 123 10*3/uL — AB (ref 150–400)
RBC: 4.17 MIL/uL (ref 3.87–5.11)
RDW: 15.7 % — ABNORMAL HIGH (ref 11.5–15.5)
WBC: 8.5 10*3/uL (ref 4.0–10.5)

## 2015-05-02 LAB — RENAL FUNCTION PANEL
Albumin: 2.9 g/dL — ABNORMAL LOW (ref 3.5–5.0)
Anion gap: 8 (ref 5–15)
BUN: 43 mg/dL — ABNORMAL HIGH (ref 6–20)
CALCIUM: 9.3 mg/dL (ref 8.9–10.3)
CHLORIDE: 104 mmol/L (ref 101–111)
CO2: 28 mmol/L (ref 22–32)
CREATININE: 1.23 mg/dL — AB (ref 0.44–1.00)
GFR calc non Af Amer: 40 mL/min — ABNORMAL LOW (ref >60)
GFR, EST AFRICAN AMERICAN: 46 mL/min — AB (ref >60)
GLUCOSE: 198 mg/dL — AB (ref 65–99)
Phosphorus: 2.6 mg/dL (ref 2.5–4.6)
Potassium: 4.3 mmol/L (ref 3.5–5.1)
SODIUM: 140 mmol/L (ref 135–145)

## 2015-05-02 LAB — CARBOXYHEMOGLOBIN
Carboxyhemoglobin: 1.6 % — ABNORMAL HIGH (ref 0.5–1.5)
METHEMOGLOBIN: 0.7 % (ref 0.0–1.5)
O2 SAT: 56.2 %
TOTAL HEMOGLOBIN: 11.9 g/dL — AB (ref 12.0–16.0)

## 2015-05-02 MED ORDER — SPIRONOLACTONE 25 MG PO TABS
12.5000 mg | ORAL_TABLET | Freq: Every day | ORAL | Status: DC
  Administered 2015-05-02 – 2015-05-03 (×2): 12.5 mg via ORAL
  Filled 2015-05-02 (×3): qty 1

## 2015-05-02 MED ORDER — METOLAZONE 2.5 MG PO TABS
2.5000 mg | ORAL_TABLET | Freq: Two times a day (BID) | ORAL | Status: AC
  Administered 2015-05-02 (×2): 2.5 mg via ORAL
  Filled 2015-05-02 (×2): qty 1

## 2015-05-02 MED ORDER — OXYCODONE-ACETAMINOPHEN 5-325 MG PO TABS
1.0000 | ORAL_TABLET | Freq: Four times a day (QID) | ORAL | Status: DC | PRN
Start: 2015-05-02 — End: 2015-05-06
  Administered 2015-05-02 – 2015-05-06 (×7): 1 via ORAL
  Filled 2015-05-02 (×7): qty 1

## 2015-05-02 NOTE — Progress Notes (Signed)
[facility] PROGRESS NOTE  Angela Stuart RUE:[id number] DOB: 19-Mar-1932 DOA: 04/21/2015 PCP: Kimber Relic, MD  Brief narrative 82 year old female with history of severe systolic CHF (EF of 35-40%) with severe mitral regurgitation, diastolic dysfunction, history of CAD status post CABG, type 2 diabetes mellitus and hypertension , recently moved from [place] to live with her family presented with several weeks of generalized weakness with fatigue and increased peripheral edema. Patient was unable to walk on the day of admission and did not improved with her home dose of Lasix. Patient found to be in acute CHF and admitted to hospitalist service. Patient diuresed poorly with IV Lasix and had to be placed on milrinone drip. Cardiology/heart failure team and palliative care consulted.   Assessment/Plan: Acute combined systolic and diastolic CHF exacerbation Cardiology on board and managing.  They are currently managing milrinone, lasix, and spironolactone Once Cardiology clears for discharge will start disposition planning  Lower extremity venous efficiency  Appears to be chronic as per patient.  No clinical signs of infection. Antibiotic discontinued. Lower extremity Doppler negative for DVT.  Acute versus acute on chronic kidney injury. Possibly associated with diuresis. Renal ultrasound unremarkable. Metformin and ACE inhibitor held. Creatinine improved today.  Type 2 diabetes mellitus A1c of 8.2. Metformin held. Continue home dose Lantus with sliding scale coverage.  - Diabetic coordinator on board  Hypokalemia  Resolved  Depression Continue Celexa  Incidental finding of liver cirrhosis on renal ultrasound. LFTs normal. Continue to monitor.  Elevated TSH Free T4 of 1.59. Recommend repeat thyroid function once acute illness resolved.  Chronic right hip pain with physical deconditioning No fractures on x-ray PT recommends skilled nursing facility. Palliative care  consulted with plan on family meeting today.   DVT prophylaxis: Subcutaneous Lovenox  Diet: Heart healthy/diabetic  Code Status:DNR Family Communication: none at bedside Disposition Plan: Continue inpatient monitoring.   Consultants:  cardiology ( Dr Algie Coffer)  palliative care  Procedures:  2-D echo  Antibiotics:  Doxycycline (stopped)  HPI/Subjective: Pt has no new complaints today  Objective: Filed Vitals:   05/02/15 0707 05/02/15 1200  BP: 108/60 85/48  Pulse: 63 99  Temp: 98.8 F (37.1 C) 98.7 F (37.1 C)  Resp: 18 18    Intake/Output Summary (Last 24 hours) at 05/02/15 1658 Last data filed at 05/02/15 1300  Gross per 24 hour  Intake    465 ml  Output   1525 ml  Net  -1060 ml   Filed Weights   05/01/15 0445 05/01/15 1101 05/02/15 0707  Weight: 70.126 kg (154 lb 9.6 oz) 71.26 kg (157 lb 1.6 oz) 70.761 kg (156 lb)    Exam:   General:  NAD, alert and awake  HEENT: JVD+, moist mucosa  Cardiovascular: loud S1, normal S2, 3/6 Systolic murmur  Respiratory: diminished breath sounds b/l, no wheezes  Abdomen: soft, ND, NT, BS+  Musculoskeletal: warm, erythema b/l leg (mainly in left, chronic), 2+ pitting edema  CNS: alert and oriented  Data Reviewed: Basic Metabolic Panel:  Recent Labs Lab 04/27/15 0530 04/28/15 0356 04/29/15 0320 04/30/15 1200 05/01/15 0450 05/02/15 0500  NA 137 138 140 140 142 140  K 3.7 4.0 4.2 3.1* 3.3* 4.3  CL 105 106 106 104 105 104  CO2 GLUCOSE 117* 100* 113* 182* 151* 198*  BUN 43* 51* 52* 47* 41* 43*  CREATININE 1.37* 1.70* 1.74* 1.41* 1.20* 1.23*  CALCIUM 9.0 9.6 9.4 9.3 9.4 9.3  MG 1.5*  --   --   --   --   --  PHOS  --   --   --   --  2.8 2.6   Liver Function Tests:  Recent Labs Lab 05/01/15 0450 05/02/15 0500  ALBUMIN 2.8* 2.9*   No results for input(s): LIPASE, AMYLASE in the last 168 hours. No results for input(s): AMMONIA in the last 168 hours. CBC:  Recent Labs Lab  04/26/15 0337 04/27/15 0530 04/28/15 0356 04/29/15 0320 05/02/15 0500  WBC 7.1 6.7 9.0 7.3 8.5  HGB 12.0 12.3 13.3 13.1 11.8*  HCT 38.4 39.1 41.8 40.5 36.8  MCV 88.7 88.7 89.1 87.5 88.2  PLT 146* 137* 172 155 123*   Cardiac Enzymes: No results for input(s): CKTOTAL, CKMB, CKMBINDEX, TROPONINI in the last 168 hours. BNP (last 3 results)  Recent Labs  04/21/15 1939 04/29/15 0320  BNP >4500.0* >4500.0*    ProBNP (last 3 results) No results for input(s): PROBNP in the last 8760 hours.  CBG:  Recent Labs Lab 05/01/15 1116 05/01/15 1632 05/01/15 2253 05/02/15 0559 05/02/15 1220  GLUCAP 125* 252* 188* 180* 253*    No results found for this or any previous visit (from the past 240 hour(s)).   Studies: No results found.  Scheduled Meds: . aspirin EC  81 mg Oral Daily  . citalopram  10 mg Oral Daily  . enoxaparin (LOVENOX) injection  30 mg Subcutaneous Q24H  . ferrous sulfate  325 mg Oral BID  . hydrocortisone   Rectal TID  . insulin aspart  0-15 Units Subcutaneous TID WC  . insulin glargine  12 Units Subcutaneous q morning - 10a  . isosorbide-hydrALAZINE  0.5 tablet Oral TID  . metolazone  2.5 mg Oral BID  . potassium chloride  40 mEq Oral BID  . rosuvastatin  5 mg Oral QPM  . spironolactone  12.5 mg Oral Daily   Continuous Infusions: . furosemide (LASIX) infusion 15 mg/hr (05/02/15 0911)  . milrinone 0.375 mcg/kg/min (05/02/15 1354)      Time spent: 25 minutes    [name], [facility]  [facility] Pager [phone]. If 7PM-7AM, please contact night-coverage at [email or web address], password [email or web address] 05/02/2015, 4:58 PM  LOS: 11 days

## 2015-05-02 NOTE — Progress Notes (Signed)
Subjective:  Complains of musculoskeletal back pain. Denies any chest pain states breathing and leg swelling has improved  Objective:  Vital Signs in the last 24 hours: Temp:  [97.8 F (36.6 C)-98.8 F (37.1 C)] 98.8 F (37.1 C) (01/22 0707) Pulse Rate:  [63-95] 63 (01/22 0707) Resp:  [18] 18 (01/22 0707) BP: (102-108)/(51-60) 108/60 mmHg (01/22 0707) SpO2:  [97 %-100 %] 100 % (01/22 0707) Weight:  [70.761 kg (156 lb)-71.26 kg (157 lb 1.6 oz)] 70.761 kg (156 lb) (01/22 0707)  Intake/Output from previous day: 01/21 0701 - 01/22 0700 In: 935 [P.O.:590; I.V.:345] Out: 1725 [Urine:1725] Intake/Output from this shift: Total I/O In: -  Out: 600 [Urine:600]  Physical Exam: Neck: no adenopathy, no carotid bruit, no JVD and supple, symmetrical, trachea midline Lungs: Decreased breath sound at bases Heart: regular rate and rhythm, S1, S2 normal and 2/6 systolic murmur noted Abdomen: soft, non-tender; bowel sounds normal; no masses,  no organomegaly Extremities: No clubbing cyanosis 1+ edema noted  Lab Results:  Recent Labs  05/02/15 0500  WBC 8.5  HGB 11.8*  PLT 123*    Recent Labs  05/01/15 0450 05/02/15 0500  NA 142 140  K 3.3* 4.3  CL 105 104  CO2 28 28  GLUCOSE 151* 198*  BUN 41* 43*  CREATININE 1.20* 1.23*   No results for input(s): TROPONINI in the last 72 hours.  Invalid input(s): CK, MB Hepatic Function Panel  Recent Labs  05/02/15 0500  ALBUMIN 2.9*   No results for input(s): CHOL in the last 72 hours. No results for input(s): PROTIME in the last 72 hours.  Imaging: Imaging results have been reviewed and No results found.  Cardiac Studies:  Assessment/Plan:  Resolving acute on chronic systolic heart failure Ischemic artery myopathy CAD history of MI in the past status post CABG Hypertension Diabetes mellitus Hypothyroidism Hyperlipidemia Cardiorenal syndrome  Plan Continue present management Check labs in a.m.  LOS: 11 days     Rinaldo Cloud 05/02/2015, 11:00 AM

## 2015-05-02 NOTE — Progress Notes (Signed)
Patient ID: Angela Stuart, female   DOB: December 12, 1931, 82 y.o.   MRN: [id number]  Advanced Heart Failure Rounding Note  PCP: None per pt. Recently moved to [place] from [place].   Subjective:    She still did not diurese particularly well yesterday despite metolazone and Lasix gtt.  Weight down 1 lb.  Creatinine stable.  Sitting in chair, no dyspnea at rest.    Objective:   Weight Range: 156 lb (70.761 kg) Body mass index is 25.19 kg/(m^2).   Vital Signs:   Temp:  [97.8 F (36.6 C)-98.8 F (37.1 C)] 98.8 F (37.1 C) (01/22 0707) Pulse Rate:  [63-95] 63 (01/22 0707) Resp:  [18] 18 (01/22 0707) BP: (102-108)/(51-60) 108/60 mmHg (01/22 0707) SpO2:  [97 %-100 %] 100 % (01/22 0707) Weight:  [156 lb (70.761 kg)-157 lb 1.6 oz (71.26 kg)] 156 lb (70.761 kg) (01/22 0707) Last BM Date: 04/28/15  Weight change: Filed Weights   05/01/15 0445 05/01/15 1101 05/02/15 0707  Weight: 154 lb 9.6 oz (70.126 kg) 157 lb 1.6 oz (71.26 kg) 156 lb (70.761 kg)    Intake/Output:   Intake/Output Summary (Last 24 hours) at 05/02/15 0908 Last data filed at 05/02/15 0708  Gross per 24 hour  Intake    935 ml  Output   1725 ml  Net   -790 ml     Physical Exam: General: Chronically ill and elderly appearing. NAD HEENT: normal Neck: supple. JVP to 14-16 cm. Carotids 2+ bilat; no bruits. No thyromegaly or lymphadenopathy noted Cor: PMI nondisplaced. RRR, 3/6 TR, 2/6 MR murmur. +S3 Lungs: Slight basilar crackles, normal effort.  Abdomen: soft, NT, mild/moderately distended, no HSM. No bruits or masses. +BS Extremities: no cyanosis, clubbing, rash. 2+ edema to knees bilaterally. Neuro: alert & orientedx3, cranial nerves grossly intact. moves all 4 extremities w/o difficulty. Affect pleasant  Telemetry: Reviewed personally, NSR 80s with occasional PVCs.   Labs: CBC  Recent Labs  05/02/15 0500  WBC 8.5  HGB 11.8*  HCT 36.8  MCV 88.2  PLT 123*   Basic Metabolic Panel  Recent Labs  05/01/15 0450  05/02/15 0500  NA 142 140  K 3.3* 4.3  CL 105 104  CO2 28 28  GLUCOSE 151* 198*  BUN 41* 43*  CREATININE 1.20* 1.23*  CALCIUM 9.4 9.3  PHOS 2.8 2.6   Liver Function Tests  Recent Labs  05/01/15 0450 05/02/15 0500  ALBUMIN 2.8* 2.9*   No results for input(s): LIPASE, AMYLASE in the last 72 hours. Cardiac Enzymes No results for input(s): CKTOTAL, CKMB, CKMBINDEX, TROPONINI in the last 72 hours.  BNP: BNP (last 3 results)  Recent Labs  04/21/15 1939 04/29/15 0320  BNP >4500.0* >4500.0*    ProBNP (last 3 results) No results for input(s): PROBNP in the last 8760 hours.   D-Dimer No results for input(s): DDIMER in the last 72 hours. Hemoglobin A1C No results for input(s): HGBA1C in the last 72 hours. Fasting Lipid Panel No results for input(s): CHOL, HDL, LDLCALC, TRIG, CHOLHDL, LDLDIRECT in the last 72 hours. Thyroid Function Tests No results for input(s): TSH, T4TOTAL, T3FREE, THYROIDAB in the last 72 hours.  Invalid input(s): FREET3  Other results:     Imaging/Studies:  No results found.  Latest Echo  Latest Cath   Medications:     Scheduled Medications: . aspirin EC  81 mg Oral Daily  . citalopram  10 mg Oral Daily  . enoxaparin (LOVENOX) injection  30 mg Subcutaneous Q24H  . ferrous sulfate  325 mg Oral BID  . hydrocortisone   Rectal TID  . insulin aspart  0-15 Units Subcutaneous TID WC  . insulin glargine  12 Units Subcutaneous q morning - 10a  . isosorbide-hydrALAZINE  0.5 tablet Oral TID  . metolazone  2.5 mg Oral BID  . potassium chloride  40 mEq Oral BID  . rosuvastatin  5 mg Oral QPM  . spironolactone  12.5 mg Oral Daily    Infusions: . furosemide (LASIX) infusion 12 mg/hr (05/01/15 1215)  . milrinone 0.25 mcg/kg/min (05/01/15 2151)    PRN Medications: sodium chloride, acetaminophen, clonazePAM, ondansetron (ZOFRAN) IV, oxyCODONE-acetaminophen, polyvinyl alcohol, promethazine, sodium chloride, sodium chloride   Assessment    1. A/C systolic HF: echo 04/23/15 initially read as 35-40%, looks more like 20-25%. Myoview 04/27/15 EF 21%.  She has low output  2. CAD s/p CABG x 4 1971: Small area of anterior ischemia only on Cardiolite this admission.  3. AKI on CKD stage III 4. Endocrine 5. S/p Fall 04/28/15 6. DNR/DNI   Plan   Coox somewhat marginal at 56% this am on milrinone 0.25. She remains volume overloaded on exam and did not diurese well yesterday.  Renal indices stable so far. - Milrinone to 0.375 to support diuresis.  - Increase Lasix gtt to 15 mg bid and will give metolazone bid today.  - Add spironolactone 12.5 daily.  - Continue Bidil 1/2 tab tid.  ACEI held with increased creatinine.   Severe MR on echo, likely functional.   Will eventually need RHC/LHC, however would wait until fully diuresed and creatinine stable.  Not urgent, she has had no chest pain in the hospital and primary symptoms seem to be heart failure-related.  Mild elevation in troponin at admission was flat and likely related to demand ischemia from volume overload.   Length of Stay: [address] 05/02/2015, 9:08 AM  Advanced Heart Failure Team Pager [phone] (M-F; 7a - 4p)  Please contact [facility] for night-coverage after hours (4p -7a ) and weekends on [email or web address]

## 2015-05-03 LAB — RENAL FUNCTION PANEL
ANION GAP: 12 (ref 5–15)
Albumin: 2.9 g/dL — ABNORMAL LOW (ref 3.5–5.0)
BUN: 50 mg/dL — ABNORMAL HIGH (ref 6–20)
CALCIUM: 9.5 mg/dL (ref 8.9–10.3)
CHLORIDE: 100 mmol/L — AB (ref 101–111)
CO2: 27 mmol/L (ref 22–32)
Creatinine, Ser: 1.35 mg/dL — ABNORMAL HIGH (ref 0.44–1.00)
GFR calc Af Amer: 41 mL/min — ABNORMAL LOW (ref >60)
GFR calc non Af Amer: 36 mL/min — ABNORMAL LOW (ref >60)
GLUCOSE: 218 mg/dL — AB (ref 65–99)
Phosphorus: 2.5 mg/dL (ref 2.5–4.6)
Potassium: 4.4 mmol/L (ref 3.5–5.1)
SODIUM: 139 mmol/L (ref 135–145)

## 2015-05-03 LAB — GLUCOSE, CAPILLARY
GLUCOSE-CAPILLARY: 204 mg/dL — AB (ref 65–99)
GLUCOSE-CAPILLARY: 157 mg/dL — AB (ref 65–99)
Glucose-Capillary: 195 mg/dL — ABNORMAL HIGH (ref 65–99)
Glucose-Capillary: 149 mg/dL — ABNORMAL HIGH (ref 65–99)

## 2015-05-03 LAB — CBC
HCT: 36.7 % (ref 36.0–46.0)
Hemoglobin: 11.6 g/dL — ABNORMAL LOW (ref 12.0–15.0)
MCH: 28 pg (ref 26.0–34.0)
MCHC: 31.6 g/dL (ref 30.0–36.0)
MCV: 88.4 fL (ref 78.0–100.0)
PLATELETS: 122 10*3/uL — AB (ref 150–400)
RBC: 4.15 MIL/uL (ref 3.87–5.11)
RDW: 15.9 % — AB (ref 11.5–15.5)
WBC: 8.8 10*3/uL (ref 4.0–10.5)

## 2015-05-03 LAB — BASIC METABOLIC PANEL
ANION GAP: 12 (ref 5–15)
BUN: 50 mg/dL — AB (ref 6–20)
CALCIUM: 9.4 mg/dL (ref 8.9–10.3)
CHLORIDE: 100 mmol/L — AB (ref 101–111)
CO2: 27 mmol/L (ref 22–32)
CREATININE: 1.33 mg/dL — AB (ref 0.44–1.00)
GFR calc non Af Amer: 36 mL/min — ABNORMAL LOW (ref >60)
GFR, EST AFRICAN AMERICAN: 42 mL/min — AB (ref >60)
Glucose, Bld: 216 mg/dL — ABNORMAL HIGH (ref 65–99)
Potassium: 4.5 mmol/L (ref 3.5–5.1)
Sodium: 139 mmol/L (ref 135–145)

## 2015-05-03 LAB — CARBOXYHEMOGLOBIN
Carboxyhemoglobin: 1.6 % — ABNORMAL HIGH (ref 0.5–1.5)
METHEMOGLOBIN: 0.5 % (ref 0.0–1.5)
O2 SAT: 71 %
TOTAL HEMOGLOBIN: 16.1 g/dL — AB (ref 12.0–16.0)

## 2015-05-03 LAB — BRAIN NATRIURETIC PEPTIDE

## 2015-05-03 MED ORDER — METOLAZONE 5 MG PO TABS
5.0000 mg | ORAL_TABLET | Freq: Two times a day (BID) | ORAL | Status: AC
  Administered 2015-05-03 (×2): 5 mg via ORAL
  Filled 2015-05-03 (×2): qty 1

## 2015-05-03 MED ORDER — METOLAZONE 5 MG PO TABS: 5.0000 mg | ORAL_TABLET | Freq: Two times a day (BID) | ORAL | Status: DC

## 2015-05-03 NOTE — Progress Notes (Signed)
Physical Therapy Treatment Patient Details Name: Angela Stuart MRN: [id number] DOB: 06-14-1931 Today's Date: 05/03/2015    History of Present Illness 82 yo female with onset of CHF and renal insufficiency is having elevated troponin from demand ischemia, LE edema with RLE redness, pericardial effusion.  PMHx:  CABG, CHF    PT Comments    Patient requires increased encouragement to participate this session, remains limited in activity tolerance and mobility. Ambulated shorter distance secondary to increased fatigue and overall unwell feeling. HR and O2 saturations stable. Encouraged bilateral LE elevation for edema control.   Follow Up Recommendations  SNF;Supervision/Assistance - 24 hour     Equipment Recommendations  Rolling walker with 5" wheels    Recommendations for Other Services       Precautions / Restrictions Precautions Precautions: Fall (telemetry) Restrictions Weight Bearing Restrictions: No    Mobility  Bed Mobility               General bed mobility comments: received sitting EOB  Transfers Overall transfer level: Needs assistance Equipment used: Rolling walker (2 wheeled) Transfers: Sit to/from Stand Sit to Stand: Min guard         General transfer comment: Vcs for positioning, max cues for encouragement  Ambulation/Gait Ambulation/Gait assistance: Min guard Ambulation Distance (Feet): 110 Feet Assistive device: Rolling walker (2 wheeled);1 person hand held assist Gait Pattern/deviations: Step-through pattern;Decreased stride length;Shuffle;Trunk flexed;Antalgic Gait velocity: decreased Gait velocity interpretation: Below normal speed for age/gender General Gait Details: patient ambulated in hall with one extended seated rest break upon fatigue. Tolerated well, HR stable and denies SOB   Stairs            Wheelchair Mobility    Modified Rankin (Stroke Patients Only)       Balance   Sitting-balance support: Feet  supported Sitting balance-Leahy Scale: Good     Standing balance support: Bilateral upper extremity supported Standing balance-Leahy Scale: Fair                      Cognition Arousal/Alertness: Awake/alert Behavior During Therapy: Flat affect Overall Cognitive Status: Within Functional Limits for tasks assessed                      Exercises Other Exercises Other Exercises: reeducated on LE elevation and edema control    General Comments General comments (skin integrity, edema, etc.): increased bilateral LE edema dispite compression hose      Pertinent Vitals/Pain Pain Assessment: 0-10 Pain Score: 4  Pain Location: low back Pain Descriptors / Indicators: Sore Pain Intervention(s): Limited activity within patient's tolerance;Monitored during session;Repositioned    Home Living                      Prior Function            PT Goals (current goals can now be found in the care plan section) Acute Rehab PT Goals Patient Stated Goal: to walk safely and get home PT Goal Formulation: With patient Time For Goal Achievement: 05/08/15 Potential to Achieve Goals: Good Progress towards PT goals: Progressing toward goals (modestly (requires max encouragement))    Frequency  Min 2X/week    PT Plan Current plan remains appropriate    Co-evaluation             End of Session Equipment Utilized During Treatment: Gait belt Activity Tolerance: Patient tolerated treatment well Patient left: in chair;with call bell/phone within reach;with chair alarm set  Time: 1610-9604 PT Time Calculation (min) (ACUTE ONLY): 21 min  Charges:  $Gait Training: 8-22 mins                    G CodesFabio Asa May 30, 2015, 1:30 PM Charlotte Crumb, PT DPT  [phone]

## 2015-05-03 NOTE — Care Management Important Message (Signed)
Important Message  Patient Details  Name: Angela Stuart MRN: [id number] Date of Birth: Nov 01, 1931   Medicare Important Message Given:  Yes    Oralia Rud Laquasia Pincus 05/03/2015, 2:35 PM

## 2015-05-03 NOTE — Progress Notes (Signed)
Ref: GREEN, Lenon Curt, MD   Subjective:  Has nose bleed today without oxygen use. Platelets counts dropped 50 K over 10 day period.  Objective:  Vital Signs in the last 24 hours: Temp:  [98 F (36.7 C)-98.4 F (36.9 C)] 98.4 F (36.9 C) (01/23 1110) Pulse Rate:  [94-103] 101 (01/23 1110) Cardiac Rhythm:  [-] Sinus tachycardia (01/23 0833) Resp:  [18-20] 18 (01/23 1110) BP: (85-91)/(38-50) 85/50 mmHg (01/23 1110) SpO2:  [93 %-99 %] 95 % (01/23 1110) Weight:  [70.806 kg (156 lb 1.6 oz)] 70.806 kg (156 lb 1.6 oz) (01/23 0510)  Physical Exam: BP Readings from Last 1 Encounters:  05/03/15 85/50    Wt Readings from Last 1 Encounters:  05/03/15 70.806 kg (156 lb 1.6 oz)    Weight change: -0.499 kg (-1 lb 1.6 oz)  HEENT: [place]/AT, Eyes-Light brown, PERL, EOMI, Conjunctiva-Pink, Sclera-Non-icteric Neck: No JVD, No bruit, Trachea midline. Lungs:  Fine crackles at bases, Bilateral. Cardiac:  Regular rhythm, normal S1 and S2, no S3.  Abdomen:  Soft, non-tender. Extremities:  1 + edema present. No cyanosis. No clubbing. CNS: AxOx3, Cranial nerves grossly intact, moves all 4 extremities. Right handed. Skin: Warm and dry.   Intake/Output from previous day: 01/22 0701 - 01/23 0700 In: 1620.8 [P.O.:1080; I.V.:540.8] Out: 1900 [Urine:1900]    Lab Results: BMET    Component Value Date/Time   NA 139 05/03/2015 0525   NA 139 05/03/2015 0525   NA 140 05/02/2015 0500   K 4.4 05/03/2015 0525   K 4.5 05/03/2015 0525   K 4.3 05/02/2015 0500   CL 100* 05/03/2015 0525   CL 100* 05/03/2015 0525   CL 104 05/02/2015 0500   CO2 27 05/03/2015 0525   CO2 27 05/03/2015 0525   CO2 28 05/02/2015 0500   GLUCOSE 218* 05/03/2015 0525   GLUCOSE 216* 05/03/2015 0525   GLUCOSE 198* 05/02/2015 0500   BUN 50* 05/03/2015 0525   BUN 50* 05/03/2015 0525   BUN 43* 05/02/2015 0500   CREATININE 1.35* 05/03/2015 0525   CREATININE 1.33* 05/03/2015 0525   CREATININE 1.23* 05/02/2015 0500   CALCIUM 9.5  05/03/2015 0525   CALCIUM 9.4 05/03/2015 0525   CALCIUM 9.3 05/02/2015 0500   GFRNONAA 36* 05/03/2015 0525   GFRNONAA 36* 05/03/2015 0525   GFRNONAA 40* 05/02/2015 0500   GFRAA 41* 05/03/2015 0525   GFRAA 42* 05/03/2015 0525   GFRAA 46* 05/02/2015 0500   CBC    Component Value Date/Time   WBC 8.8 05/03/2015 0525   RBC 4.15 05/03/2015 0525   HGB 11.6* 05/03/2015 0525   HCT 36.7 05/03/2015 0525   PLT 122* 05/03/2015 0525   MCV 88.4 05/03/2015 0525   MCH 28.0 05/03/2015 0525   MCHC 31.6 05/03/2015 0525   RDW 15.9* 05/03/2015 0525   LYMPHSABS 1.5 04/21/2015 1938   MONOABS 0.5 04/21/2015 1938   EOSABS 0.2 04/21/2015 1938   BASOSABS 0.0 04/21/2015 1938   HEPATIC Function Panel  Recent Labs  04/24/15 1151  PROT 6.3*   HEMOGLOBIN A1C No components found for: HGA1C,  MPG CARDIAC ENZYMES Lab Results  Component Value Date   TROPONINI 0.13* 04/22/2015   TROPONINI 0.15* 04/22/2015   TROPONINI 0.16* 04/21/2015   BNP No results for input(s): PROBNP in the last 8760 hours. TSH  Recent Labs  04/24/15 1151  TSH 5.028*   CHOLESTEROL No results for input(s): CHOL in the last 8760 hours.  Scheduled Meds: . aspirin EC  81 mg Oral Daily  .  citalopram  10 mg Oral Daily  . enoxaparin (LOVENOX) injection  30 mg Subcutaneous Q24H  . ferrous sulfate  325 mg Oral BID  . hydrocortisone   Rectal TID  . insulin aspart  0-15 Units Subcutaneous TID WC  . insulin glargine  12 Units Subcutaneous q morning - 10a  . isosorbide-hydrALAZINE  0.5 tablet Oral TID  . metolazone  5 mg Oral BID  . potassium chloride  40 mEq Oral BID  . rosuvastatin  5 mg Oral QPM  . spironolactone  12.5 mg Oral Daily   Continuous Infusions: . furosemide (LASIX) infusion 15 mg/hr (05/03/15 0147)  . milrinone 0.375 mcg/kg/min (05/03/15 1313)   PRN Meds:.sodium chloride, acetaminophen, clonazePAM, ondansetron (ZOFRAN) IV, oxyCODONE-acetaminophen, polyvinyl alcohol, promethazine, sodium chloride, sodium  chloride  Assessment/Plan: Acute on chronic left heart systolic failure Ischemic cardiomyopathy Acute on chronic renal failure Abnormal troponin-I from renal insufficiency or demand ischemia DM, II Dyslipidemia Depression Hypothyroidism unlikely with elevated free T 4 and borderline TSH elevation in sick patient. Hypoalbuminemia Epistaxis Thrombocytopenia, mild   Possible adverse effect of IV lasix for nose bleed/Will ask Dr. Gala Romney Mild worsening of renal function over weekend. Follow with heart failure team. Wants medical treatment only.   LOS: 12 days    Orpah Cobb  MD  05/03/2015, 2:57 PM

## 2015-05-03 NOTE — Progress Notes (Signed)
[facility] PROGRESS NOTE  Angela Stuart ZOX:[id number] DOB: [birth year] DOA: 04/21/2015 PCP: Kimber Relic, MD  Brief narrative 82 year old female with history of severe systolic CHF (EF of 35-40%) with severe mitral regurgitation, diastolic dysfunction, history of CAD status post CABG, type 2 diabetes mellitus and hypertension , recently moved from [place] to live with her family presented with several weeks of generalized weakness with fatigue and increased peripheral edema. Patient was unable to walk on the day of admission and did not improved with her home dose of Lasix. Patient found to be in acute CHF and admitted to hospitalist service. Patient diuresed poorly with IV Lasix and had to be placed on milrinone drip. Cardiology/heart failure team and palliative care consulted.   Assessment/Plan: Acute combined systolic and diastolic CHF exacerbation Cardiology on board and managing.  They are currently managing milrinone, lasix, and spironolactone. Once Cardiology clears for discharge will start disposition planning  Non sustained V tach - Will defer recommendations to Cardiology. Have requested nursing update cardiology.  Lower extremity venous efficiency  Appears to be chronic as per patient.  No clinical signs of infection. Antibiotic discontinued. Lower extremity Doppler negative for DVT.  Acute versus acute on chronic kidney injury. Possibly associated with diuresis. Renal ultrasound unremarkable. Metformin and ACE inhibitor held. Creatinine improved today.  Type 2 diabetes mellitus A1c of 8.2. Metformin held. Continue home dose Lantus with sliding scale coverage.  - Diabetic coordinator on board  Hypokalemia  Resolved  Depression Continue Celexa  Incidental finding of liver cirrhosis on renal ultrasound. LFTs normal. Continue to monitor.  Elevated TSH Free T4 of 1.59. Recommend repeat thyroid function once acute illness resolved.  Chronic right hip  pain with physical deconditioning No fractures on x-ray PT recommends skilled nursing facility. Palliative care consulted with plan on family meeting today.   DVT prophylaxis: Subcutaneous Lovenox  Diet: Heart healthy/diabetic  Code Status:DNR Family Communication: none at bedside Disposition Plan: Continue inpatient monitoring.   Consultants:  cardiology ( Dr Algie Coffer)  palliative care  Procedures:  2-D echo  Antibiotics:  Doxycycline (stopped)  HPI/Subjective: Pt has no new complaints today. Nurse reported non sustained V tach this am  Objective: Filed Vitals:   05/02/15 1954 05/03/15 0510  BP: 91/47 88/38  Pulse: 94 103  Temp: 98.1 F (36.7 C) 98 F (36.7 C)  Resp: 18 20    Intake/Output Summary (Last 24 hours) at 05/03/15 1041 Last data filed at 05/03/15 1000  Gross per 24 hour  Intake 1260.78 ml  Output   1550 ml  Net -289.22 ml   Filed Weights   05/01/15 1101 05/02/15 0707 05/03/15 0510  Weight: 71.26 kg (157 lb 1.6 oz) 70.761 kg (156 lb) 70.806 kg (156 lb 1.6 oz)    Exam:   General:  NAD, alert and awake  HEENT: JVD+, moist mucosa  Cardiovascular: loud S1, normal S2, 3/6 Systolic murmur  Respiratory: diminished breath sounds b/l, no wheezes  Abdomen: soft, ND, NT, BS+  Musculoskeletal: warm, erythema b/l leg (mainly in left, chronic), 2+ pitting edema  CNS: alert and oriented  Data Reviewed: Basic Metabolic Panel:  Recent Labs Lab 04/27/15 0530  04/29/15 0320 04/30/15 1200 05/01/15 0450 05/02/15 0500 05/03/15 0525  NA 137  < > 140 140 142 140 139  139  K 3.7  < > 4.2 3.1* 3.3* 4.3 4.5  4.4  CL 105  < > 106 104 105 104 100*  100*  CO2 23  < > 28  27  27  GLUCOSE 117*  < > 113* 182* 151* 198* 216*  218*  BUN 43*  < > 52* 47* 41* 43* 50*  50*  CREATININE 1.37*  < > 1.74* 1.41* 1.20* 1.23* 1.33*  1.35*  CALCIUM 9.0  < > 9.4 9.3 9.4 9.3 9.4  9.5  MG 1.5*  --   --   --   --   --   --   PHOS  --   --   --   --   2.8 2.6 2.5  < > = values in this interval not displayed. Liver Function Tests:  Recent Labs Lab 05/01/15 0450 05/02/15 0500 05/03/15 0525  ALBUMIN 2.8* 2.9* 2.9*   No results for input(s): LIPASE, AMYLASE in the last 168 hours. No results for input(s): AMMONIA in the last 168 hours. CBC:  Recent Labs Lab 04/27/15 0530 04/28/15 0356 04/29/15 0320 05/02/15 0500 05/03/15 0525  WBC 6.7 9.0 7.3 8.5 8.8  HGB 12.3 13.3 13.1 11.8* 11.6*  HCT 39.1 41.8 40.5 36.8 36.7  MCV 88.7 89.1 87.5 88.2 88.4  PLT 137* 172 155 123* 122*   Cardiac Enzymes: No results for input(s): CKTOTAL, CKMB, CKMBINDEX, TROPONINI in the last 168 hours. BNP (last 3 results)  Recent Labs  04/21/15 1939 04/29/15 0320 05/03/15 0525  BNP >4500.0* >4500.0* >4500.0*    ProBNP (last 3 results) No results for input(s): PROBNP in the last 8760 hours.  CBG:  Recent Labs Lab 05/02/15 0559 05/02/15 1220 05/02/15 1650 05/02/15 2109 05/03/15 0538  GLUCAP 180* 253* 276* 256* 195*    No results found for this or any previous visit (from the past 240 hour(s)).   Studies: No results found.  Scheduled Meds: . aspirin EC  81 mg Oral Daily  . citalopram  10 mg Oral Daily  . enoxaparin (LOVENOX) injection  30 mg Subcutaneous Q24H  . ferrous sulfate  325 mg Oral BID  . hydrocortisone   Rectal TID  . insulin aspart  0-15 Units Subcutaneous TID WC  . insulin glargine  12 Units Subcutaneous q morning - 10a  . isosorbide-hydrALAZINE  0.5 tablet Oral TID  . metolazone  5 mg Oral BID  . potassium chloride  40 mEq Oral BID  . rosuvastatin  5 mg Oral QPM  . spironolactone  12.5 mg Oral Daily   Continuous Infusions: . furosemide (LASIX) infusion 15 mg/hr (05/03/15 0147)  . milrinone 0.375 mcg/kg/min (05/03/15 0147)      Time spent: 25 minutes    Dakotah Orrego, [facility]  [facility] Pager [phone]. If 7PM-7AM, please contact night-coverage at [email or web address], password [facility] 05/03/2015, 10:41 AM  LOS: 12  days

## 2015-05-03 NOTE — Progress Notes (Signed)
Patient ID: Angela Stuart, female   DOB: 11-18-1931, 82 y.o.   MRN: [id number]  Advanced Heart Failure Rounding Note  PCP: None per pt. Recently moved to [place] from [place].   Subjective:    Volume relatively unchanged despite increase of milrinone, lasix, and addition of metolazone yesterday. Continues to have orthopnea. Otherwise no SOB at rest. No CP. Occasionally lightheadedness/dizzy with standing.   Objective:   Weight Range: 156 lb 1.6 oz (70.806 kg) Body mass index is 25.21 kg/(m^2).   Vital Signs:   Temp:  [98 F (36.7 C)-98.7 F (37.1 C)] 98 F (36.7 C) (01/23 0510) Pulse Rate:  [94-103] 103 (01/23 0510) Resp:  [18-20] 20 (01/23 0510) BP: (85-91)/(38-48) 88/38 mmHg (01/23 0510) SpO2:  [93 %-99 %] 93 % (01/23 0510) Weight:  [156 lb 1.6 oz (70.806 kg)] 156 lb 1.6 oz (70.806 kg) (01/23 0510) Last BM Date: 04/28/15  Weight change: Filed Weights   05/01/15 1101 05/02/15 0707 05/03/15 0510  Weight: 157 lb 1.6 oz (71.26 kg) 156 lb (70.761 kg) 156 lb 1.6 oz (70.806 kg)    Intake/Output:   Intake/Output Summary (Last 24 hours) at 05/03/15 0834 Last data filed at 05/03/15 0654  Gross per 24 hour  Intake 1620.78 ml  Output   1600 ml  Net  20.78 ml     Physical Exam: General: Chronically ill and elderly appearing. NAD HEENT: normal Neck: supple. JVP to jaw +. Carotids 2+ bilat; no bruits. No thyromegaly or nodule noted Cor: PMI nondisplaced. RRR, 3/6 TR, 2/6 MR murmur. +S3 Lungs: Basilar crackles, normal effort Abdomen: soft, NT, mild/moderately distended, no HSM. No bruits or masses. +BS Extremities: no cyanosis, clubbing, rash. 2+ edema into thighs bilaterally. Neuro: alert & orientedx3, cranial nerves grossly intact. moves all 4 extremities w/o difficulty. Affect pleasant  Telemetry: Reviewed personally, NSR 80s with occasional PVCs. 10 beat run of Vtach. Asymptomatic.  Labs: CBC  Recent Labs  05/02/15 0500 05/03/15 0525  WBC 8.5 8.8  HGB 11.8* 11.6*  HCT 36.8  36.7  MCV 88.2 88.4  PLT 123* 122*   Basic Metabolic Panel  Recent Labs  05/02/15 0500 05/03/15 0525  NA 140 139  139  K 4.3 4.5  4.4  CL 104 100*  100*  CO2 GLUCOSE 198* 216*  218*  BUN 43* 50*  50*  CREATININE 1.23* 1.33*  1.35*  CALCIUM 9.3 9.4  9.5  PHOS 2.6 2.5   Liver Function Tests  Recent Labs  05/02/15 0500 05/03/15 0525  ALBUMIN 2.9* 2.9*   No results for input(s): LIPASE, AMYLASE in the last 72 hours. Cardiac Enzymes No results for input(s): CKTOTAL, CKMB, CKMBINDEX, TROPONINI in the last 72 hours.  BNP: BNP (last 3 results)  Recent Labs  04/21/15 1939 04/29/15 0320 05/03/15 0525  BNP >4500.0* >4500.0* >4500.0*    ProBNP (last 3 results) No results for input(s): PROBNP in the last 8760 hours.   D-Dimer No results for input(s): DDIMER in the last 72 hours. Hemoglobin A1C No results for input(s): HGBA1C in the last 72 hours. Fasting Lipid Panel No results for input(s): CHOL, HDL, LDLCALC, TRIG, CHOLHDL, LDLDIRECT in the last 72 hours. Thyroid Function Tests No results for input(s): TSH, T4TOTAL, T3FREE, THYROIDAB in the last 72 hours.  Invalid input(s): FREET3  Other results:     Imaging/Studies:  No results found.  Latest Echo  Latest Cath   Medications:     Scheduled Medications: . aspirin EC  81 mg  Oral Daily  . citalopram  10 mg Oral Daily  . enoxaparin (LOVENOX) injection  30 mg Subcutaneous Q24H  . ferrous sulfate  325 mg Oral BID  . hydrocortisone   Rectal TID  . insulin aspart  0-15 Units Subcutaneous TID WC  . insulin glargine  12 Units Subcutaneous q morning - 10a  . isosorbide-hydrALAZINE  0.5 tablet Oral TID  . potassium chloride  40 mEq Oral BID  . rosuvastatin  5 mg Oral QPM  . spironolactone  12.5 mg Oral Daily    Infusions: . furosemide (LASIX) infusion 15 mg/hr (05/03/15 0147)  . milrinone 0.375 mcg/kg/min (05/03/15 0147)    PRN Medications: sodium chloride, acetaminophen,  clonazePAM, ondansetron (ZOFRAN) IV, oxyCODONE-acetaminophen, polyvinyl alcohol, promethazine, sodium chloride, sodium chloride   Assessment   1. A/C systolic HF: echo 04/23/15 initially read as 35-40%, looks more like 20-25%. Myoview 04/27/15 EF 21%.  She has low output  2. CAD s/p CABG x 4 1971: Small area of anterior ischemia only on Cardiolite this admission.  3. AKI on CKD stage III 4. Endocrine 5. S/p Fall 04/28/15 6. DNR/DNI   Plan   Coox 71% on milrinone 0.375 (increased 05/02/15). She remains volume overloaded on exam and did not diurese well yesterday.  Creatinine slightly up from yesterday. - Continue Lasix gtt 15. Give metolazone 5 mg BID today. Supp K as needed to keep > 4.0 Place TED hose to knees.  Spironolactone 12.5 mg added 05/02/15. Continue Bidil 1/2 tab tid.  ACEI held with increased creatinine. No uptitration with soft BP. Cleda Daub added yesterday. May need to hold if SBP < 80.  Severe MR on echo 04/23/15, likely functional.   Family and pt have refused cath multiple times.   Length of Stay: 12  Graciella Freer PA-C  05/03/2015   Advanced Heart Failure Team Pager [phone] (M-F; 7a - 4p)  Please contact [facility] for night-coverage after hours (4p -7a ) and weekends on [email or web address]  Patient seen and examined with Otilio Saber, PA-C. We discussed all aspects of the encounter. I agree with the assessment and plan as stated above.   Urine output continue to be very sluggish despite normalization of MV sat with inotropic support in addition to lasix gtt and metolazone. Will increase lasix gtt to 20. She refuses RHC. May need to try diamox. Electrolytes ok. Renal function stable 1.2-1.3. PT following. D/x Palliative Care team as well. She is DNR but would like aggressive care for HF.   Bensimhon, Daniel,MD 1:10 AM

## 2015-05-03 NOTE — Progress Notes (Signed)
CARDIAC REHAB PHASE I   Pt sitting up in recliner, eyes closed, awake. Pt declines ambulation at this time, opened eyes, states she is "resting," then closed eyes, difficult to engage. Pt also declines education at this time. Will follow up this afternoon as schedule permits, if not, will follow up tomorrow.  Joylene Grapes, RN, BSN 05/03/2015 1:29 PM

## 2015-05-03 NOTE — Progress Notes (Signed)
Get called from [facility], pt had 10 beats of V-tach, pt is asymptomatic and stable, informed MD,will continue to monitor.

## 2015-05-04 ENCOUNTER — Inpatient Hospital Stay (HOSPITAL_COMMUNITY): Payer: Medicare Other

## 2015-05-04 DIAGNOSIS — Z7189 Other specified counseling: Secondary | ICD-10-CM | POA: Insufficient documentation

## 2015-05-04 DIAGNOSIS — Z515 Encounter for palliative care: Secondary | ICD-10-CM | POA: Insufficient documentation

## 2015-05-04 LAB — GLUCOSE, CAPILLARY
GLUCOSE-CAPILLARY: 205 mg/dL — AB (ref 65–99)
GLUCOSE-CAPILLARY: 191 mg/dL — AB (ref 65–99)
GLUCOSE-CAPILLARY: 176 mg/dL — AB (ref 65–99)
Glucose-Capillary: 189 mg/dL — ABNORMAL HIGH (ref 65–99)
Glucose-Capillary: 198 mg/dL — ABNORMAL HIGH (ref 65–99)

## 2015-05-04 LAB — BASIC METABOLIC PANEL
Anion gap: 11 (ref 5–15)
BUN: 56 mg/dL — AB (ref 6–20)
CHLORIDE: 98 mmol/L — AB (ref 101–111)
CO2: 27 mmol/L (ref 22–32)
Calcium: 9.5 mg/dL (ref 8.9–10.3)
Creatinine, Ser: 1.64 mg/dL — ABNORMAL HIGH (ref 0.44–1.00)
GFR calc Af Amer: 33 mL/min — ABNORMAL LOW (ref >60)
GFR calc non Af Amer: 28 mL/min — ABNORMAL LOW (ref >60)
GLUCOSE: 198 mg/dL — AB (ref 65–99)
POTASSIUM: 4.8 mmol/L (ref 3.5–5.1)
SODIUM: 136 mmol/L (ref 135–145)

## 2015-05-04 LAB — CARBOXYHEMOGLOBIN
CARBOXYHEMOGLOBIN: 2.3 % — AB (ref 0.5–1.5)
Carboxyhemoglobin: 1.6 % — ABNORMAL HIGH (ref 0.5–1.5)
METHEMOGLOBIN: 0.5 % (ref 0.0–1.5)
Methemoglobin: 0.6 % (ref 0.0–1.5)
O2 Saturation: 50.3 %
O2 Saturation: 62.3 %
TOTAL HEMOGLOBIN: 11.6 g/dL — AB (ref 12.0–16.0)
Total hemoglobin: 11.8 g/dL — ABNORMAL LOW (ref 12.0–16.0)

## 2015-05-04 MED ORDER — ACETAZOLAMIDE ER 500 MG PO CP12
500.0000 mg | ORAL_CAPSULE | Freq: Two times a day (BID) | ORAL | Status: DC
  Administered 2015-05-04: 500 mg via ORAL
  Filled 2015-05-04 (×4): qty 1

## 2015-05-04 MED ORDER — BENZONATATE 100 MG PO CAPS
100.0000 mg | ORAL_CAPSULE | Freq: Three times a day (TID) | ORAL | Status: DC | PRN
  Administered 2015-05-04: 100 mg via ORAL
  Filled 2015-05-04: qty 1

## 2015-05-04 MED ORDER — METOLAZONE 5 MG PO TABS
5.0000 mg | ORAL_TABLET | Freq: Two times a day (BID) | ORAL | Status: DC
  Administered 2015-05-04: 5 mg via ORAL
  Filled 2015-05-04 (×2): qty 1

## 2015-05-04 NOTE — Progress Notes (Signed)
No change in d/c plan at this time.  Active bed search continues to awaiting medical stability per MD.  Angela Stuart, [place] [phone]

## 2015-05-04 NOTE — Progress Notes (Signed)
Patient ID: Angela Stuart, female   DOB: 1931-08-08, 82 y.o.   MRN: [id number]  Advanced Heart Failure Rounding Note  PCP: None per pt. Recently moved to [place] from [place].   Subjective:    Volume status relatively unchanged despite increase of milrinone, lasix, and metolazone. Continues to have orthopnea. Otherwise no SOB at rest. No CP.   Weight up today. Co-ox initially 50% but on repeat 62.3%  Objective:   Weight Range: 74.118 kg (163 lb 6.4 oz) Body mass index is 26.39 kg/(m^2).   Vital Signs:   Temp:  [98.4 F (36.9 C)-98.9 F (37.2 C)] 98.7 F (37.1 C) (01/24 0521) Pulse Rate:  [101-108] 107 (01/24 0949) Resp:  [18-20] 18 (01/24 0521) BP: (83-105)/(44-54) 105/54 mmHg (01/24 0949) SpO2:  [94 %-96 %] 95 % (01/24 0521) Weight:  [74.118 kg (163 lb 6.4 oz)] 74.118 kg (163 lb 6.4 oz) (01/24 0521) Last BM Date: 04/28/15  Weight change: Filed Weights   05/02/15 0707 05/03/15 0510 05/04/15 0521  Weight: 70.761 kg (156 lb) 70.806 kg (156 lb 1.6 oz) 74.118 kg (163 lb 6.4 oz)    Intake/Output:   Intake/Output Summary (Last 24 hours) at 05/04/15 1001 Last data filed at 05/04/15 0800  Gross per 24 hour  Intake    540 ml  Output    950 ml  Net   -410 ml     Physical Exam: General: Chronically ill and elderly appearing. NAD in bed.  HEENT: normal Neck: supple. JVP to jaw +. Carotids 2+ bilat; no bruits. No thyromegaly or nodule noted Cor: PMI nondisplaced. RRR, 3/6 TR, 2/6 MR murmur. +S3 Lungs: Basilar crackles, normal effort Abdomen: soft, NT, mild/moderately distended, no HSM. No bruits or masses. +BS Extremities: no cyanosis, clubbing, rash. 3+ edema into thighs bilaterally. Neuro: alert & orientedx3, cranial nerves grossly intact. moves all 4 extremities w/o difficulty. Affect pleasant  Telemetry: Reviewed personally, NSR 80s with occasional PVCs. 10 beat run of Vtach. Asymptomatic.  Labs: CBC  Recent Labs  05/02/15 0500 05/03/15 0525  WBC 8.5 8.8  HGB 11.8* 11.6*   HCT 36.8 36.7  MCV 88.2 88.4  PLT 123* 122*   Basic Metabolic Panel  Recent Labs  05/02/15 0500 05/03/15 0525 05/04/15 0435  NA 140 139  139 136  K 4.3 4.5  4.4 4.8  CL 104 100*  100* 98*  CO2 GLUCOSE 198* 216*  218* 198*  BUN 43* 50*  50* 56*  CREATININE 1.23* 1.33*  1.35* 1.64*  CALCIUM 9.3 9.4  9.5 9.5  PHOS 2.6 2.5  --    Liver Function Tests  Recent Labs  05/02/15 0500 05/03/15 0525  ALBUMIN 2.9* 2.9*   No results for input(s): LIPASE, AMYLASE in the last 72 hours. Cardiac Enzymes No results for input(s): CKTOTAL, CKMB, CKMBINDEX, TROPONINI in the last 72 hours.  BNP: BNP (last 3 results)  Recent Labs  04/21/15 1939 04/29/15 0320 05/03/15 0525  BNP >4500.0* >4500.0* >4500.0*    ProBNP (last 3 results) No results for input(s): PROBNP in the last 8760 hours.   D-Dimer No results for input(s): DDIMER in the last 72 hours. Hemoglobin A1C No results for input(s): HGBA1C in the last 72 hours. Fasting Lipid Panel No results for input(s): CHOL, HDL, LDLCALC, TRIG, CHOLHDL, LDLDIRECT in the last 72 hours. Thyroid Function Tests No results for input(s): TSH, T4TOTAL, T3FREE, THYROIDAB in the last 72 hours.  Invalid input(s): FREET3  Other results:  Imaging/Studies:  No results found.  Latest Echo  Latest Cath   Medications:     Scheduled Medications: . aspirin EC  81 mg Oral Daily  . citalopram  10 mg Oral Daily  . enoxaparin (LOVENOX) injection  30 mg Subcutaneous Q24H  . ferrous sulfate  325 mg Oral BID  . hydrocortisone   Rectal TID  . insulin aspart  0-15 Units Subcutaneous TID WC  . insulin glargine  12 Units Subcutaneous q morning - 10a  . isosorbide-hydrALAZINE  0.5 tablet Oral TID  . metolazone  5 mg Oral BID  . potassium chloride  40 mEq Oral BID  . rosuvastatin  5 mg Oral QPM  . spironolactone  12.5 mg Oral Daily    Infusions: . furosemide (LASIX) infusion 30 mg/hr (05/04/15 0958)  . milrinone  0.375 mcg/kg/min (05/04/15 0340)    PRN Medications: sodium chloride, acetaminophen, benzonatate, clonazePAM, ondansetron (ZOFRAN) IV, oxyCODONE-acetaminophen, polyvinyl alcohol, promethazine, sodium chloride, sodium chloride   Assessment   1. A/C systolic HF: echo 04/23/15 initially read as 35-40%, looks more like 20-25%. Myoview 04/27/15 EF 21%.  She has low output  2. CAD s/p CABG x 4 1971: Small area of anterior ischemia only on Cardiolite this admission.  3. AKI on CKD stage III 4. Endocrine 5. S/p Fall 04/28/15 6. DNR/DNI   Plan    Urine output continue to be very sluggish despite normalization of MV sat with inotropic support in addition to lasix gtt and metolazone. Will increase lasix gtt to 30 and start diamox. Will place UNNA boots.. She refuses RHC. Electrolytes ok. Renal function slightly worse. She is DNR but would like aggressive care for HF. I worry we may be running out of options. She continues to have significant S3 on exam despite inotropic support.   Bensimhon, Daniel,MD 10:01 AM

## 2015-05-04 NOTE — Progress Notes (Signed)
Ref: GREEN, Lenon Curt, MD   Subjective:  Small furhter diuresis with milrinone and lasix drip. Discussed care with son who is looking forward to SNF/Rehab and if improves then assisted living. Afebrile.  Objective:  Vital Signs in the last 24 hours: Temp:  [98.6 F (37 C)-98.9 F (37.2 C)] 98.6 F (37 C) (01/24 1131) Pulse Rate:  [105-108] 106 (01/24 1131) Cardiac Rhythm:  [-] Sinus tachycardia (01/24 1900) Resp:  [18-20] 18 (01/24 1131) BP: (83-110)/(44-57) 108/53 mmHg (01/24 1131) SpO2:  [94 %-96 %] 95 % (01/24 1131) Weight:  [74.118 kg (163 lb 6.4 oz)] 74.118 kg (163 lb 6.4 oz) (01/24 0521)  Physical Exam: BP Readings from Last 1 Encounters:  05/04/15 108/53    Wt Readings from Last 1 Encounters:  05/04/15 74.118 kg (163 lb 6.4 oz)    Weight change: 3.357 kg (7 lb 6.4 oz)  HEENT: [place]/AT, Eyes-Light brown, Conjunctiva-Pink, Sclera-Non-icteric Neck: No JVD, No bruit, Trachea midline. Lungs:  Clearing, Bilateral. Cardiac:  Regular rhythm, normal S1 and S2, + S3.  Abdomen:  Soft, non-tender. Extremities:  2 + edema present. No cyanosis. No clubbing. CNS: AxOx2, Cranial nerves grossly intact, moves all 4 extremities. Right handed. Skin: Warm and dry.   Intake/Output from previous day: 01/23 0701 - 01/24 0700 In: 540 [P.O.:540] Out: 950 [Urine:950]    Lab Results: BMET    Component Value Date/Time   NA 136 05/04/2015 0435   NA 139 05/03/2015 0525   NA 139 05/03/2015 0525   K 4.8 05/04/2015 0435   K 4.4 05/03/2015 0525   K 4.5 05/03/2015 0525   CL 98* 05/04/2015 0435   CL 100* 05/03/2015 0525   CL 100* 05/03/2015 0525   CO2 27 05/04/2015 0435   CO2 27 05/03/2015 0525   CO2 27 05/03/2015 0525   GLUCOSE 198* 05/04/2015 0435   GLUCOSE 218* 05/03/2015 0525   GLUCOSE 216* 05/03/2015 0525   BUN 56* 05/04/2015 0435   BUN 50* 05/03/2015 0525   BUN 50* 05/03/2015 0525   CREATININE 1.64* 05/04/2015 0435   CREATININE 1.35* 05/03/2015 0525   CREATININE 1.33* 05/03/2015  0525   CALCIUM 9.5 05/04/2015 0435   CALCIUM 9.5 05/03/2015 0525   CALCIUM 9.4 05/03/2015 0525   GFRNONAA 28* 05/04/2015 0435   GFRNONAA 36* 05/03/2015 0525   GFRNONAA 36* 05/03/2015 0525   GFRAA 33* 05/04/2015 0435   GFRAA 41* 05/03/2015 0525   GFRAA 42* 05/03/2015 0525   CBC    Component Value Date/Time   WBC 8.8 05/03/2015 0525   RBC 4.15 05/03/2015 0525   HGB 11.6* 05/03/2015 0525   HCT 36.7 05/03/2015 0525   PLT 122* 05/03/2015 0525   MCV 88.4 05/03/2015 0525   MCH 28.0 05/03/2015 0525   MCHC 31.6 05/03/2015 0525   RDW 15.9* 05/03/2015 0525   LYMPHSABS 1.5 04/21/2015 1938   MONOABS 0.5 04/21/2015 1938   EOSABS 0.2 04/21/2015 1938   BASOSABS 0.0 04/21/2015 1938   HEPATIC Function Panel  Recent Labs  04/24/15 1151  PROT 6.3*   HEMOGLOBIN A1C No components found for: HGA1C,  MPG CARDIAC ENZYMES Lab Results  Component Value Date   TROPONINI 0.13* 04/22/2015   TROPONINI 0.15* 04/22/2015   TROPONINI 0.16* 04/21/2015   BNP No results for input(s): PROBNP in the last 8760 hours. TSH  Recent Labs  04/24/15 1151  TSH 5.028*   CHOLESTEROL No results for input(s): CHOL in the last 8760 hours.  Scheduled Meds: . acetaZOLAMIDE  500 mg Oral  Q12H  . aspirin EC  81 mg Oral Daily  . citalopram  10 mg Oral Daily  . enoxaparin (LOVENOX) injection  30 mg Subcutaneous Q24H  . ferrous sulfate  325 mg Oral BID  . hydrocortisone   Rectal TID  . insulin aspart  0-15 Units Subcutaneous TID WC  . insulin glargine  12 Units Subcutaneous q morning - 10a  . isosorbide-hydrALAZINE  0.5 tablet Oral TID  . metolazone  5 mg Oral BID  . potassium chloride  40 mEq Oral BID  . rosuvastatin  5 mg Oral QPM  . spironolactone  12.5 mg Oral Daily   Continuous Infusions: . furosemide (LASIX) infusion 30 mg/hr (05/04/15 1434)  . milrinone 0.375 mcg/kg/min (05/04/15 1237)   PRN Meds:.sodium chloride, acetaminophen, benzonatate, clonazePAM, ondansetron (ZOFRAN) IV,  oxyCODONE-acetaminophen, polyvinyl alcohol, promethazine, sodium chloride, sodium chloride  Assessment/Plan: Acute on chronic left heart systolic failure Ischemic cardiomyopathy Acute on chronic renal failure Abnormal troponin-I from renal insufficiency or demand ischemia DM, II Dyslipidemia Depression Hypothyroidism unlikely with elevated free T 4 and borderline TSH elevation in sick patient. Hypoalbuminemia Epistaxis-improving Thrombocytopenia, mild  Continue medical treatment and follow with heart failure team. SNF when feasible.     LOS: 13 days    Orpah Cobb  MD  05/04/2015, 8:00 PM

## 2015-05-04 NOTE — Progress Notes (Signed)
[facility] PROGRESS NOTE  Angela Stuart ZOX:[id number] DOB: 26-Aug-1931 DOA: 04/21/2015 PCP: Kimber Relic, MD  Brief narrative 82 year old female with history of severe systolic CHF (EF of 35-40%) with severe mitral regurgitation, diastolic dysfunction, history of CAD status post CABG, type 2 diabetes mellitus and hypertension , recently moved from [place] to live with her family presented with several weeks of generalized weakness with fatigue and increased peripheral edema. Patient was unable to walk on the day of admission and did not improved with her home dose of Lasix. Patient found to be in acute CHF and admitted to hospitalist service. Patient diuresed poorly with IV Lasix and had to be placed on milrinone drip. Cardiology/heart failure team and palliative care consulted for goals of care.   Assessment/Plan: Acute combined systolic and diastolic CHF exacerbation Cardiology on board and managing.  They are currently managing milrinone, lasix, and spironolactone. Patient had syncopal episode. After discussion patient opted for a comfort care approach to her current problems.   Non sustained V tach - resolved and plans are for comfort care  Lower extremity venous efficiency  Appears to be chronic as per patient.  No clinical signs of infection. Antibiotic discontinued. Lower extremity Doppler negative for DVT.  Acute versus acute on chronic kidney injury. Possibly associated with diuresis. Renal ultrasound unremarkable. Metformin and ACE inhibitor held. Creatinine improved today.  Type 2 diabetes mellitus A1c of 8.2. Metformin held. Continue home dose Lantus with sliding scale coverage.  - Diabetic coordinator on board  Hypokalemia  Resolved  Depression Continue Celexa  Incidental finding of liver cirrhosis on renal ultrasound. LFTs normal. Continue to monitor.  Elevated TSH Plan is for comfort care  Chronic right hip pain with physical deconditioning No  fractures on x-ray PT recommends skilled nursing facility. Palliative care consulted again today 05/04/15   DVT prophylaxis: Subcutaneous Lovenox  Diet: Heart healthy/diabetic  Code Status:DNR Family Communication: none at bedside Disposition Plan: Pending discussion with palliative care team   Consultants:  cardiology ( Dr Algie Coffer)  palliative care  Procedures:  2-D echo  Antibiotics:  None  HPI/Subjective: Pt had syncopal episode when transferred to chair. After discussion with patient she would like comfort care measures  Objective: Filed Vitals:   05/04/15 1016 05/04/15 1131  BP: 110/57 108/53  Pulse: 108 106  Temp:  98.6 F (37 C)  Resp:  18    Intake/Output Summary (Last 24 hours) at 05/04/15 1659 Last data filed at 05/04/15 1321  Gross per 24 hour  Intake    220 ml  Output    550 ml  Net   -330 ml   Filed Weights   05/02/15 0707 05/03/15 0510 05/04/15 0521  Weight: 70.761 kg (156 lb) 70.806 kg (156 lb 1.6 oz) 74.118 kg (163 lb 6.4 oz)    Exam:   General:  NAD, alert and awake  HEENT: JVD+, dry mucous membranes  Cardiovascular: loud S1, normal S2, 3/6 Systolic murmur  Respiratory: diminished breath sounds b/l, no wheezes, equal chest rise  Abdomen: soft, ND, NT, BS+  Musculoskeletal: warm, erythema b/l leg (mainly in left, chronic), 2+ pitting edema  CNS: alert and oriented  Data Reviewed: Basic Metabolic Panel:  Recent Labs Lab 04/30/15 1200 05/01/15 0450 05/02/15 0500 05/03/15 0525 05/04/15 0435  NA 140 142 140 139  139 136  K 3.1* 3.3* 4.3 4.5  4.4 4.8  CL 104 105 104 100*  100* 98*  CO2 GLUCOSE 182*  151* 198* 216*  218* 198*  BUN 47* 41* 43* 50*  50* 56*  CREATININE 1.41* 1.20* 1.23* 1.33*  1.35* 1.64*  CALCIUM 9.3 9.4 9.3 9.4  9.5 9.5  PHOS  --  2.8 2.6 2.5  --    Liver Function Tests:  Recent Labs Lab 05/01/15 0450 05/02/15 0500 05/03/15 0525  ALBUMIN 2.8* 2.9* 2.9*   No results  for input(s): LIPASE, AMYLASE in the last 168 hours. No results for input(s): AMMONIA in the last 168 hours. CBC:  Recent Labs Lab 04/28/15 0356 04/29/15 0320 05/02/15 0500 05/03/15 0525  WBC 9.0 7.3 8.5 8.8  HGB 13.3 13.1 11.8* 11.6*  HCT 41.8 40.5 36.8 36.7  MCV 89.1 87.5 88.2 88.4  PLT 172 155 123* 122*   Cardiac Enzymes: No results for input(s): CKTOTAL, CKMB, CKMBINDEX, TROPONINI in the last 168 hours. BNP (last 3 results)  Recent Labs  04/21/15 1939 04/29/15 0320 05/03/15 0525  BNP >4500.0* >4500.0* >4500.0*    ProBNP (last 3 results) No results for input(s): PROBNP in the last 8760 hours.  CBG:  Recent Labs Lab 05/03/15 1617 05/03/15 2049 05/04/15 0553 05/04/15 1012 05/04/15 1134  GLUCAP 204* 157* 205* 191* 189*    No results found for this or any previous visit (from the past 240 hour(s)).   Studies: No results found.  Scheduled Meds: . acetaZOLAMIDE  500 mg Oral Q12H  . aspirin EC  81 mg Oral Daily  . citalopram  10 mg Oral Daily  . enoxaparin (LOVENOX) injection  30 mg Subcutaneous Q24H  . ferrous sulfate  325 mg Oral BID  . hydrocortisone   Rectal TID  . insulin aspart  0-15 Units Subcutaneous TID WC  . insulin glargine  12 Units Subcutaneous q morning - 10a  . isosorbide-hydrALAZINE  0.5 tablet Oral TID  . metolazone  5 mg Oral BID  . potassium chloride  40 mEq Oral BID  . rosuvastatin  5 mg Oral QPM  . spironolactone  12.5 mg Oral Daily   Continuous Infusions: . furosemide (LASIX) infusion 30 mg/hr (05/04/15 1434)  . milrinone 0.375 mcg/kg/min (05/04/15 1237)      Time spent: 25 minutes    Parks Czajkowski, [facility]  [facility] Pager [phone]. If 7PM-7AM, please contact night-coverage at [email or web address], password [facility] 05/04/2015, 4:59 PM  LOS: 13 days

## 2015-05-04 NOTE — Progress Notes (Signed)
Pt had episode of vtach. Pt asysmptomatic.observed pt closely

## 2015-05-04 NOTE — Progress Notes (Signed)
Daily Progress Note   Patient Name: Angela Stuart       Date: 05/04/2015 DOB: 11/01/1931  Age: 82 y.o. MRN#: [id number] Attending Physician: Penny Pia, MD Primary Care Physician: Kimber Relic, MD Admit Date: 04/21/2015  Reason for Consultation/Follow-up: Establishing goals of care  Subjective: Received call that patient has continued to have inadequate diureses despite high dose regimen.  She has been refusing to eat, drink , or take medications.   I talked with Angela Stuart about her clinical course over the last several days. She reports feeling "terrible."  She reports lots of nausea.  We talked about the fact that she has not been getting the benefit from therapy that she had been hoping.  She reports "it is what it is."   Length of Stay: 13 days  Current Medications: Scheduled Meds:  . acetaZOLAMIDE  500 mg Oral Q12H  . aspirin EC  81 mg Oral Daily  . citalopram  10 mg Oral Daily  . enoxaparin (LOVENOX) injection  30 mg Subcutaneous Q24H  . ferrous sulfate  325 mg Oral BID  . hydrocortisone   Rectal TID  . insulin aspart  0-15 Units Subcutaneous TID WC  . insulin glargine  12 Units Subcutaneous q morning - 10a  . isosorbide-hydrALAZINE  0.5 tablet Oral TID  . metolazone  5 mg Oral BID  . potassium chloride  40 mEq Oral BID  . rosuvastatin  5 mg Oral QPM  . spironolactone  12.5 mg Oral Daily    Continuous Infusions: . furosemide (LASIX) infusion 30 mg/hr (05/04/15 1434)  . milrinone 0.375 mcg/kg/min (05/04/15 1237)    PRN Meds: sodium chloride, acetaminophen, benzonatate, clonazePAM, ondansetron (ZOFRAN) IV, oxyCODONE-acetaminophen, polyvinyl alcohol, promethazine, sodium chloride, sodium chloride  Physical Exam: Physical Exam   General: Chronically ill and  elderly appearing. NAD HEENT: normal Neck: supple. JVP to jaw CV: RRR, S3 Lungs: Crackles in bases, normal effort.  Abdomen: soft, NT, mild/moderately distended, no HSM. No bruits or masses. +BS Extremities: 2+ edema Neuro: alert & oriented, no gross deficits            Vital Signs: BP 96/61 mmHg  Pulse 100  Temp(Src) 98.1 F (36.7 C) (Oral)  Resp 18  Ht  (1.676 m)  Wt 74.118 kg (163 lb 6.4 oz)  BMI 26.39  kg/m2  SpO2 99% SpO2: SpO2: 99 % O2 Device: O2 Device: Not Delivered O2 Flow Rate:    Intake/output summary:  Intake/Output Summary (Last 24 hours) at 05/04/15 2224 Last data filed at 05/04/15 2128  Gross per 24 hour  Intake      0 ml  Output    700 ml  Net   -700 ml   LBM: Last BM Date: 04/28/15 Baseline Weight: Weight: 77.973 kg (171 lb 14.4 oz) Most recent weight: Weight: 74.118 kg (163 lb 6.4 oz) (scale a)       Palliative Assessment/Data: Flowsheet Rows        Most Recent Value   Intake Tab    Referral Department  Cardiology   Unit at Time of Referral  Cardiac/Telemetry Unit   Palliative Care Primary Diagnosis  Cardiac   Date Notified  04/29/15   Palliative Care Type  New Palliative care   Reason for referral  Clarify Goals of Care   Date of Admission  04/21/15   Date first seen by Palliative Care  04/30/15   # of days Palliative referral response time  1 Day(s)   # of days IP prior to Palliative referral  8   Clinical Assessment    Palliative Performance Scale Score  50%   Pain Max last 24 hours  6   Pain Min Last 24 hours  0   Psychosocial & Spiritual Assessment    Palliative Care Outcomes    Patient/Family meeting held?  Yes   Who was at the meeting?  Patient, her son, and her daughter-in-law   Palliative Care Outcomes  Clarified goals of care, Completed durable DNR   Patient/Family wishes: Interventions discontinued/not started   Mechanical Ventilation, BiPAP      Additional Data Reviewed: CBC    Component Value Date/Time   WBC 8.8  05/03/2015 0525   RBC 4.15 05/03/2015 0525   HGB 11.6* 05/03/2015 0525   HCT 36.7 05/03/2015 0525   PLT 122* 05/03/2015 0525   MCV 88.4 05/03/2015 0525   MCH 28.0 05/03/2015 0525   MCHC 31.6 05/03/2015 0525   RDW 15.9* 05/03/2015 0525   LYMPHSABS 1.5 04/21/2015 1938   MONOABS 0.5 04/21/2015 1938   EOSABS 0.2 04/21/2015 1938   BASOSABS 0.0 04/21/2015 1938    CMP     Component Value Date/Time   NA 136 05/04/2015 0435   K 4.8 05/04/2015 0435   CL 98* 05/04/2015 0435   CO2 27 05/04/2015 0435   GLUCOSE 198* 05/04/2015 0435   BUN 56* 05/04/2015 0435   CREATININE 1.64* 05/04/2015 0435   CALCIUM 9.5 05/04/2015 0435   PROT 6.3* 04/24/2015 1151   ALBUMIN 2.9* 05/03/2015 0525   AST 27 04/24/2015 1151   ALT 13* 04/24/2015 1151   ALKPHOS 70 04/24/2015 1151   BILITOT 1.2 04/24/2015 1151   GFRNONAA 28* 05/04/2015 0435   GFRAA 33* 05/04/2015 0435       Problem List:  Patient Active Problem List   Diagnosis Date Noted  . Acute on chronic congestive heart failure (HCC)   . Acute on chronic left systolic heart failure (HCC)   . Acute kidney injury (HCC)   . Hypokalemia   . CHF, acute on chronic (HCC) 04/21/2015  . Renal insufficiency 04/21/2015  . DM2 (diabetes mellitus, type 2) (HCC) 04/21/2015     Palliative Care Assessment & Plan    1.Code Status:  DNR    Code Status Orders        Start  Ordered   04/29/15 1208  Do not attempt resuscitation (DNR)   Continuous    Question Answer Comment  In the event of cardiac or respiratory ARREST Do not call a "code blue"   In the event of cardiac or respiratory ARREST Do not perform Intubation, CPR, defibrillation or ACLS   In the event of cardiac or respiratory ARREST Use medication by any route, position, wound care, and other measures to relive pain and suffering. May use oxygen, suction and manual treatment of airway obstruction as needed for comfort.      04/29/15 1208    Code Status History    Date Active Date  Inactive Code Status Order ID Comments User Context   04/21/2015  9:19 PM 04/29/2015 12:08 PM Full Code [id number]  Hillary Bow, DO ED    Advance Directive Documentation        Most Recent Value   Type of Advance Directive  Living will   Pre-existing out of facility DNR order (yellow form or pink MOST form)     "MOST" Form in Place?         2. Goals of Care/Additional Recommendations:  She reports that she feels she is not improving. She would like to have her son come to the hospital tomorrow to have another discussion about goals of care.  She seems to be leaning toward refocusing her care on her comfort.   3. Symptom Management:      1.Nausea: Reports that she has been feeling more nauseous.  I asked her nurse to provide her with her zofran.  4. Palliative Prophylaxis:   Aspiration, Bowel Regimen and Delirium Protocol  5. Prognosis: Unable to determine due to acute illness, but if she were to refocus her care on comfort, her prognosis would be less than 6 months and she would qualify for hospice support if so desired  6. Discharge Planning:  to be determined   Care plan was discussed with patient, her son, and Tonye Becket, NP.  Thank you for allowing the Palliative Medicine Team to assist in the care of this patient.   Time In: 1445 Time Out: 1525 Total Time 40 Prolonged Time Billed no        Romie Minus, MD  05/04/2015, 10:24 PM  Please contact Palliative Medicine Team phone at [phone] for questions and concerns.

## 2015-05-04 NOTE — Progress Notes (Signed)
Orthopedic Tech Progress Note Patient Details:  Angela Stuart Jun 16, 1931 [id number]  Ortho Devices Type of Ortho Device: Ace wrap, Unna boot Ortho Device/Splint Location: Bilateral unna boots Ortho Device/Splint Interventions: Application   Saul Fordyce 05/04/2015, 12:19 PM

## 2015-05-04 NOTE — Progress Notes (Signed)
1150 Went to see pt to encourage to walk. Pt in recliner. Asked pt to walk and she said did not feel like it. When asked why she just shrugged shoulders and kept eyes closed. Tried to get pt to see that it would help to make stronger. Would not even open eyes for me.  Will continue to follow as low priority as pt more appropriate for PT. Luetta Nutting RN BSN 05/04/2015 12:26 PM

## 2015-05-04 NOTE — Progress Notes (Signed)
   Syncopal episode when trying to move from bed to chair. Quickly returned baseline. BP 105/54 P107 O2 sats 95%. Ongoing marked volume overload with poor response to IV diuresis + metolazone.     Offered transfer to stepdown for CVP however she refuses transfer. Refused foley and RHC.    Now refusing to eat. Dr Cena Benton discussed with her and her son. She is agreeable to palliatve care consult for goals of care.   Continue current plan for now and can cut back meds after palliative meeting.     Amy Clegg NP-C  10:10 AM

## 2015-05-04 NOTE — Progress Notes (Addendum)
While trying to change the position from bed to chair, for 10 sec pt started shaking and like black outs, BP 105/ 54, P-107, SPO2 95%, will continue to monitor, bolus 250 cc fluid provide (code blue activated and cancelled later)

## 2015-05-05 ENCOUNTER — Ambulatory Visit: Payer: Self-pay | Admitting: Internal Medicine

## 2015-05-05 DIAGNOSIS — R52 Pain, unspecified: Secondary | ICD-10-CM

## 2015-05-05 LAB — GLUCOSE, CAPILLARY
GLUCOSE-CAPILLARY: 190 mg/dL — AB (ref 65–99)
GLUCOSE-CAPILLARY: 132 mg/dL — AB (ref 65–99)
Glucose-Capillary: 211 mg/dL — ABNORMAL HIGH (ref 65–99)
Glucose-Capillary: 188 mg/dL — ABNORMAL HIGH (ref 65–99)

## 2015-05-05 LAB — BASIC METABOLIC PANEL
ANION GAP: 13 (ref 5–15)
BUN: 67 mg/dL — AB (ref 6–20)
CHLORIDE: 93 mmol/L — AB (ref 101–111)
CO2: 27 mmol/L (ref 22–32)
Calcium: 9.7 mg/dL (ref 8.9–10.3)
Creatinine, Ser: 2.02 mg/dL — ABNORMAL HIGH (ref 0.44–1.00)
GFR calc Af Amer: 25 mL/min — ABNORMAL LOW (ref >60)
GFR, EST NON AFRICAN AMERICAN: 22 mL/min — AB (ref >60)
GLUCOSE: 219 mg/dL — AB (ref 65–99)
POTASSIUM: 4.9 mmol/L (ref 3.5–5.1)
Sodium: 133 mmol/L — ABNORMAL LOW (ref 135–145)

## 2015-05-05 LAB — CARBOXYHEMOGLOBIN
Carboxyhemoglobin: 2 % — ABNORMAL HIGH (ref 0.5–1.5)
METHEMOGLOBIN: 0.7 % (ref 0.0–1.5)
O2 Saturation: 61.6 %
TOTAL HEMOGLOBIN: 11.9 g/dL — AB (ref 12.0–16.0)

## 2015-05-05 MED ORDER — POTASSIUM CHLORIDE CRYS ER 20 MEQ PO TBCR: 20.0000 meq | EXTENDED_RELEASE_TABLET | Freq: Every day | ORAL | Status: DC

## 2015-05-05 MED ORDER — TORSEMIDE 20 MG PO TABS
100.0000 mg | ORAL_TABLET | Freq: Two times a day (BID) | ORAL | Status: DC
  Administered 2015-05-05 (×2): 100 mg via ORAL
  Filled 2015-05-05 (×3): qty 5

## 2015-05-05 MED ORDER — MIDAZOLAM HCL 2 MG/2ML IJ SOLN: 0.5000 mg | INTRAMUSCULAR | Status: DC | PRN

## 2015-05-05 NOTE — Progress Notes (Signed)
Daily Progress Note   Patient Name: Angela Stuart       Date: 05/05/2015 DOB: 01-23-1932  Age: 82 y.o. MRN#: [id number] Attending Physician: Penny Pia, MD Primary Care Physician: Kimber Relic, MD Admit Date: 04/21/2015  Reason for Consultation/Follow-up: Establishing goals of care  Subjective: Sleeping more, oral intake is minimal to nil at this point Patient awakens, and is able to answer very simple yes/no questions Appears comfortable at this point Son and daughter in law are at the bedside, patient had bilious vomiting yesterday, patient had ?pre-syncopal event yesterday while trying to go from bed to chair.  Re introduced scope of palliative medicine. Discussed patient's current symptoms in the context of her underlying illnesses and disposition discussions undertaken.   Call placed and patient's son who lives in [place] also participated in the discussion.  Introduced scope of Hospice services, discussed patient would be appropriate for Hospice at this stage. Patient was living in a semi independent/ ALF type facility prior to this hospitalization. Patient might qualify for [facility], she is hypotensive with rising renal indices and worsening volume overload, along with more sleeping and minimal to nil oral intake. Hence, prognosis appears to be days-1 week or so at this point.  Medication list reviewed. Discussed about comfort based approach to care. Patient on PO PRN Oxycodone, will add PRN Versed and monitor. Patient with codeine allergy  Patient has extensive family in [place], has lived in [place] most of her life. Husband is deceased. Has 2 sons, one son Alecia Lemming lives locally here in [place], [place] [phone]). Son from [place] will arrive in [place], [place] on 05-07-15. Hospice consult today,  consider residential hospice by 05-07-15. Discussed with son Alecia Lemming that milrinone would not be continued at [facility]. Discussed that there is no scope for artificial nutrition or hydration at this point. End of life signs and symptoms discussed in detail, all questions answered to the best of my ability.     Length of Stay: 14 days  Current Medications: Scheduled Meds:  . aspirin EC  81 mg Oral Daily  . citalopram  10 mg Oral Daily  . enoxaparin (LOVENOX) injection  30 mg Subcutaneous Q24H  . ferrous sulfate  325 mg Oral BID  . hydrocortisone   Rectal TID  . insulin aspart  0-15 Units  Subcutaneous TID WC  . insulin glargine  12 Units Subcutaneous q morning - 10a  . isosorbide-hydrALAZINE  0.5 tablet Oral TID  . [START ON 05/06/2015] potassium chloride  20 mEq Oral Daily  . rosuvastatin  5 mg Oral QPM  . torsemide  100 mg Oral BID    Continuous Infusions: . milrinone 0.375 mcg/kg/min (05/05/15 0200)    PRN Meds: sodium chloride, acetaminophen, benzonatate, clonazePAM, midazolam, ondansetron (ZOFRAN) IV, oxyCODONE-acetaminophen, polyvinyl alcohol, promethazine, sodium chloride, sodium chloride  Physical Exam: Physical Exam   General: Chronically ill and elderly appearing. NAD HEENT: normal Neck: supple. JVP to jaw CV: RRR, S3 Lungs: Crackles in bases, normal effort.  Abdomen: soft, NT, mild/moderately distended, no HSM. No bruits or masses. +BS Extremities: 2+ edema Neuro: alert & oriented, no gross deficits            Vital Signs: BP 94/51 mmHg  Pulse 103  Temp(Src) 98 F (36.7 C) (Oral)  Resp 18  Ht  (1.676 m)  Wt 75.07 kg (165 lb 8 oz)  BMI 26.73 kg/m2  SpO2 97% SpO2: SpO2: 97 % O2 Device: O2 Device: Not Delivered O2 Flow Rate:    Intake/output summary:   Intake/Output Summary (Last 24 hours) at 05/05/15 1005 Last data filed at 05/05/15 0900  Gross per 24 hour  Intake    480 ml  Output    150 ml  Net    330 ml   LBM: Last BM Date: 05/02/15 Baseline  Weight: Weight: 77.973 kg (171 lb 14.4 oz) Most recent weight: Weight: 75.07 kg (165 lb 8 oz) (scale a)       Palliative Assessment/Data: Flowsheet Rows        Most Recent Value   Intake Tab    Referral Department  Cardiology   Unit at Time of Referral  Cardiac/Telemetry Unit   Palliative Care Primary Diagnosis  Cardiac   Date Notified  04/29/15   Palliative Care Type  New Palliative care   Reason for referral  Clarify Goals of Care   Date of Admission  04/21/15   Date first seen by Palliative Care  04/30/15   # of days Palliative referral response time  1 Day(s)   # of days IP prior to Palliative referral  8   Clinical Assessment    Palliative Performance Scale Score  50%   Pain Max last 24 hours  6   Pain Min Last 24 hours  0   Psychosocial & Spiritual Assessment    Palliative Care Outcomes    Patient/Family meeting held?  Yes   Who was at the meeting?  Patient, her son, and her daughter-in-law   Palliative Care Outcomes  Clarified goals of care, Completed durable DNR   Patient/Family wishes: Interventions discontinued/not started   Mechanical Ventilation, BiPAP      Additional Data Reviewed: CBC    Component Value Date/Time   WBC 8.8 05/03/2015 0525   RBC 4.15 05/03/2015 0525   HGB 11.6* 05/03/2015 0525   HCT 36.7 05/03/2015 0525   PLT 122* 05/03/2015 0525   MCV 88.4 05/03/2015 0525   MCH 28.0 05/03/2015 0525   MCHC 31.6 05/03/2015 0525   RDW 15.9* 05/03/2015 0525   LYMPHSABS 1.5 04/21/2015 1938   MONOABS 0.5 04/21/2015 1938   EOSABS 0.2 04/21/2015 1938   BASOSABS 0.0 04/21/2015 1938    CMP     Component Value Date/Time   NA 133* 05/05/2015 0557   K 4.9 05/05/2015 0557   CL  93* 05/05/2015 0557   CO2 27 05/05/2015 0557   GLUCOSE 219* 05/05/2015 0557   BUN 67* 05/05/2015 0557   CREATININE 2.02* 05/05/2015 0557   CALCIUM 9.7 05/05/2015 0557   PROT 6.3* 04/24/2015 1151   ALBUMIN 2.9* 05/03/2015 0525   AST 27 04/24/2015 1151   ALT 13* 04/24/2015 1151    ALKPHOS 70 04/24/2015 1151   BILITOT 1.2 04/24/2015 1151   GFRNONAA 22* 05/05/2015 0557   GFRAA 25* 05/05/2015 0557       Problem List:  Patient Active Problem List   Diagnosis Date Noted  . Palliative care encounter   . Goals of care, counseling/discussion   . Acute on chronic congestive heart failure (HCC)   . Acute on chronic left systolic heart failure (HCC)   . Acute kidney injury (HCC)   . Hypokalemia   . CHF, acute on chronic (HCC) 04/21/2015  . Renal insufficiency 04/21/2015  . DM2 (diabetes mellitus, type 2) (HCC) 04/21/2015     Palliative Care Assessment & Plan    1.Code Status:  DNR    Code Status Orders        Start     Ordered   04/29/15 1208  Do not attempt resuscitation (DNR)   Continuous    Question Answer Comment  In the event of cardiac or respiratory ARREST Do not call a "code blue"   In the event of cardiac or respiratory ARREST Do not perform Intubation, CPR, defibrillation or ACLS   In the event of cardiac or respiratory ARREST Use medication by any route, position, wound care, and other measures to relive pain and suffering. May use oxygen, suction and manual treatment of airway obstruction as needed for comfort.      04/29/15 1208    Code Status History    Date Active Date Inactive Code Status Order ID Comments User Context   04/21/2015  9:19 PM 04/29/2015 12:08 PM Full Code [id number]  Hillary Bow, DO ED    Advance Directive Documentation        Most Recent Value   Type of Advance Directive  Living will   Pre-existing out of facility DNR order (yellow form or pink MOST form)     "MOST" Form in Place?         2. Goals of Care/Additional Recommendations:  Maintaining comfort measures, hospice consult.    3. Symptom Management:      Prn versed, continue PO PRN Oxycodone.   4. Palliative Prophylaxis:   Aspiration, Bowel Regimen and Delirium Protocol  5. Prognosis:   Days to 1 week or so 6. Discharge Planning:  ?residential  hospice   Care plan was discussed with patient, her son Alecia Lemming in the room, another son over the phone.  Thank you for allowing the Palliative Medicine Team to assist in the care of this patient.   Time In: 0930 Time Out: 1005 Total Time 35 Prolonged Time Billed no        Rosalin Hawking, MD  05/05/2015, 10:14 AM  Please contact Palliative Medicine Team phone at [phone] for questions and concerns.

## 2015-05-05 NOTE — Progress Notes (Signed)
0900 Noted pt for comfort care. PT following pt. We will sign off. Luetta Nutting RN BSN 05/05/2015 9:02 AM

## 2015-05-05 NOTE — Progress Notes (Signed)
Patient having severe muscle spasm to bilateral lower extremities and left upper extremity. Patient stated she is also in pain. Percocet 5/325 mg given for pain and Clonazepam 0.25 mg given for anxiety. MD notified. Patient's son at bedside. Patient calm down and sleeping at this now. Will continue to monitor.

## 2015-05-05 NOTE — Progress Notes (Signed)
Patient ID: Angela Stuart, female   DOB: [birth year], 82 y.o.   MRN: [id number]  Advanced Heart Failure Rounding Note  PCP: None per pt. Recently moved to [place] from [place].   Subjective:   Yesterday she was transitioned to comfort care. Plan for family meeting today.   On milrinone 0.375 mcg +lasix drip 30 per hour + diamox + metolazone. Poor diuresis noted. Weight continues to trend up.   Feeling ok today.  Objective:   Weight Range: 165 lb 8 oz (75.07 kg) Body mass index is 26.73 kg/(m^2).   Vital Signs:   Temp:  [98 F (36.7 C)-98.6 F (37 C)] 98 F (36.7 C) (01/25 0512) Pulse Rate:  [100-108] 103 (01/25 0512) Resp:  [18] 18 (01/25 0512) BP: (94-110)/(51-61) 94/51 mmHg (01/25 0512) SpO2:  [95 %-99 %] 97 % (01/25 0512) Weight:  [165 lb 8 oz (75.07 kg)] 165 lb 8 oz (75.07 kg) (01/25 0512) Last BM Date: 05/02/15  Weight change: Filed Weights   05/03/15 0510 05/04/15 0521 05/05/15 0512  Weight: 156 lb 1.6 oz (70.806 kg) 163 lb 6.4 oz (74.118 kg) 165 lb 8 oz (75.07 kg)    Intake/Output:   Intake/Output Summary (Last 24 hours) at 05/05/15 0831 Last data filed at 05/05/15 0600  Gross per 24 hour  Intake    240 ml  Output    150 ml  Net     90 ml     Physical Exam: General: Chronically ill and elderly appearing. NAD. Sitting on the side of the bed. Marland Kitchen  HEENT: normal Neck: supple. JVP to jaw +. Carotids 2+ bilat; no bruits. No thyromegaly or nodule noted Cor: PMI nondisplaced. RRR, 3/6 TR, 2/6 MR murmur. +S3 Lungs: Basilar crackles, normal effort Abdomen: soft, NT, mild/moderately distended, no HSM. No bruits or masses. +BS Extremities: no cyanosis, clubbing, rash. R and LLE unna boots.  R and L thigh 2-3+ edema. Neuro: alert & orientedx3, cranial nerves grossly intact. moves all 4 extremities w/o difficulty. Affect pleasant  Telemetry: NSR 80s with occasional PVCs.   Labs: CBC  Recent Labs  05/03/15 0525  WBC 8.8  HGB 11.6*  HCT 36.7  MCV 88.4  PLT 122*   Basic  Metabolic Panel  Recent Labs  05/03/15 0525 05/04/15 0435 05/05/15 0557  NA 139  139 136 133*  K 4.5  4.4 4.8 4.9  CL 100*  100* 98* 93*  CO2 GLUCOSE 216*  218* 198* 219*  BUN 50*  50* 56* 67*  CREATININE 1.33*  1.35* 1.64* 2.02*  CALCIUM 9.4  9.5 9.5 9.7  PHOS 2.5  --   --    Liver Function Tests  Recent Labs  05/03/15 0525  ALBUMIN 2.9*   No results for input(s): LIPASE, AMYLASE in the last 72 hours. Cardiac Enzymes No results for input(s): CKTOTAL, CKMB, CKMBINDEX, TROPONINI in the last 72 hours.  BNP: BNP (last 3 results)  Recent Labs  04/21/15 1939 04/29/15 0320 05/03/15 0525  BNP >4500.0* >4500.0* >4500.0*    ProBNP (last 3 results) No results for input(s): PROBNP in the last 8760 hours.   D-Dimer No results for input(s): DDIMER in the last 72 hours. Hemoglobin A1C No results for input(s): HGBA1C in the last 72 hours. Fasting Lipid Panel No results for input(s): CHOL, HDL, LDLCALC, TRIG, CHOLHDL, LDLDIRECT in the last 72 hours. Thyroid Function Tests No results for input(s): TSH, T4TOTAL, T3FREE, THYROIDAB in the last 72 hours.  Invalid input(s): FREET3  Other results:     Imaging/Studies:  No results found.  Latest Echo  Latest Cath   Medications:     Scheduled Medications: . acetaZOLAMIDE  500 mg Oral Q12H  . aspirin EC  81 mg Oral Daily  . citalopram  10 mg Oral Daily  . enoxaparin (LOVENOX) injection  30 mg Subcutaneous Q24H  . ferrous sulfate  325 mg Oral BID  . hydrocortisone   Rectal TID  . insulin aspart  0-15 Units Subcutaneous TID WC  . insulin glargine  12 Units Subcutaneous q morning - 10a  . isosorbide-hydrALAZINE  0.5 tablet Oral TID  . metolazone  5 mg Oral BID  . potassium chloride  40 mEq Oral BID  . rosuvastatin  5 mg Oral QPM  . spironolactone  12.5 mg Oral Daily    Infusions: . furosemide (LASIX) infusion 30 mg/hr (05/05/15 0445)  . milrinone 0.375 mcg/kg/min (05/05/15 0200)     PRN Medications: sodium chloride, acetaminophen, benzonatate, clonazePAM, ondansetron (ZOFRAN) IV, oxyCODONE-acetaminophen, polyvinyl alcohol, promethazine, sodium chloride, sodium chloride   Assessment   1. A/C systolic HF: echo 04/23/15 initially read as 35-40%, looks more like 20-25%. Myoview 04/27/15 EF 21%.  She has low output  2. CAD s/p CABG x 4 1971: Small area of anterior ischemia only on Cardiolite this admission.  3. AKI on CKD stage III 4. Endocrine 5. S/p Fall 04/28/15 6. DNR/DNI   Plan  Family meeting today. Yesterday she requested transition to comfort care.   Poor response to high dose diuretics. Weight continues to trend up. Renal function worse. Stop lasix drip and spiro. Transition to torsemide 100 mg twice a day. Cut back milrinone to 0.25 mcg. For now continue bidil  Disposition: per primary team.   Amy Clegg,NP-C  8:31 AM   Patient seen and examined with Tonye Becket, NP. We discussed all aspects of the encounter. I agree with the assessment and plan as stated above.   She is actively dying from advanced HF. Renal function worsening despite inotrope support. She is requesting comfort care. Agree with switching back to po diuretics. Can stop lasix drip and milrinone. Would consider admission to [facility].  Palliative Care to see again today. Family is in agreement.   Alfonso Carden,MD 11:39 AM

## 2015-05-05 NOTE — Progress Notes (Signed)
[facility] PROGRESS NOTE  Ethan Kasperski HYQ:[id number] DOB: 11-28-1931 DOA: 04/21/2015 PCP: Kimber Relic, MD  Brief narrative 82 year old female with history of severe systolic CHF (EF of 35-40%) with severe mitral regurgitation, diastolic dysfunction, history of CAD status post CABG, type 2 diabetes mellitus and hypertension , recently moved from [place] to live with her family presented with several weeks of generalized weakness with fatigue and increased peripheral edema. Patient was unable to walk on the day of admission and did not improved with her home dose of Lasix. Patient found to be in acute CHF and admitted to hospitalist service. Patient diuresed poorly with IV Lasix and had to be placed on milrinone drip. Cardiology/heart failure team and palliative care consulted for goals of care.   Assessment/Plan: Acute combined systolic and diastolic CHF exacerbation Cardiology on board and managing.  They are currently managing milrinone, lasix, and spironolactone. Patient had syncopal episode. After discussion patient opted for a comfort care approach to her current problems. Plan is to have meeting today to discuss current condition and plans moving forward. Suspect patient will go to residential hospice (if patient and family so wishes)   Non sustained V tach - resolved and plans are for comfort care  Lower extremity venous insufficiency  Appears to be chronic as per patient.  No clinical signs of infection. Antibiotic discontinued. Lower extremity Doppler negative for DVT.  Acute versus acute on chronic kidney injury. Possibly associated with diuresis. Renal ultrasound unremarkable. Metformin and ACE inhibitor held. Creatinine improved today.  Type 2 diabetes mellitus A1c of 8.2. Metformin held. Continue home dose Lantus with sliding scale coverage.  - Diabetic coordinator on board  Hypokalemia  Resolved  Depression Continue Celexa  Incidental finding of  liver cirrhosis on renal ultrasound. LFTs normal. Continue to monitor.  Elevated TSH Plan is for comfort care  Chronic right hip pain with physical deconditioning No fractures on x-ray PT recommends skilled nursing facility. Palliative care consulted again today 05/04/15   DVT prophylaxis: Subcutaneous Lovenox  Diet: Heart healthy/diabetic  Code Status:DNR Family Communication: none at bedside Disposition Plan: Pending discussion with palliative care team (meeting today 05/05/15)   Consultants:  cardiology ( Dr Algie Coffer)  palliative care  Procedures:  2-D echo  Antibiotics:  None  HPI/Subjective: Pt had syncopal episode when transferred to chair. After discussion with patient she would like comfort care measures  Objective: Filed Vitals:   05/04/15 2123 05/05/15 0512  BP: 96/61 94/51  Pulse: 100 103  Temp: 98.1 F (36.7 C) 98 F (36.7 C)  Resp: 18 18    Intake/Output Summary (Last 24 hours) at 05/05/15 1033 Last data filed at 05/05/15 0900  Gross per 24 hour  Intake    480 ml  Output    150 ml  Net    330 ml   Filed Weights   05/03/15 0510 05/04/15 0521 05/05/15 0512  Weight: 70.806 kg (156 lb 1.6 oz) 74.118 kg (163 lb 6.4 oz) 75.07 kg (165 lb 8 oz)    Exam:   General:  NAD, alert and awake  HEENT: JVD+, dry mucous membranes  Cardiovascular: loud S1, normal S2, 3/6 Systolic murmur  Respiratory: equal chest rise, no wheezes  Abdomen: soft, ND, NT, BS+  Musculoskeletal: warm, erythema b/l leg (mainly in left, chronic), 2+ pitting edema  CNS: alert and oriented  Data Reviewed: Basic Metabolic Panel:  Recent Labs Lab 05/01/15 0450 05/02/15 0500 05/03/15 0525 05/04/15 0435 05/05/15 0557  NA 142 140 139  139  136 133*  K 3.3* 4.3 4.5  4.4 4.8 4.9  CL 105 104 100*  100* 98* 93*  CO2 GLUCOSE 151* 198* 216*  218* 198* 219*  BUN 41* 43* 50*  50* 56* 67*  CREATININE 1.20* 1.23* 1.33*  1.35* 1.64* 2.02*  CALCIUM  9.4 9.3 9.4  9.5 9.5 9.7  PHOS 2.8 2.6 2.5  --   --    Liver Function Tests:  Recent Labs Lab 05/01/15 0450 05/02/15 0500 05/03/15 0525  ALBUMIN 2.8* 2.9* 2.9*   No results for input(s): LIPASE, AMYLASE in the last 168 hours. No results for input(s): AMMONIA in the last 168 hours. CBC:  Recent Labs Lab 04/29/15 0320 05/02/15 0500 05/03/15 0525  WBC 7.3 8.5 8.8  HGB 13.1 11.8* 11.6*  HCT 40.5 36.8 36.7  MCV 87.5 88.2 88.4  PLT 155 123* 122*   Cardiac Enzymes: No results for input(s): CKTOTAL, CKMB, CKMBINDEX, TROPONINI in the last 168 hours. BNP (last 3 results)  Recent Labs  04/21/15 1939 04/29/15 0320 05/03/15 0525  BNP >4500.0* >4500.0* >4500.0*    ProBNP (last 3 results) No results for input(s): PROBNP in the last 8760 hours.  CBG:  Recent Labs Lab 05/04/15 1012 05/04/15 1134 05/04/15 1620 05/04/15 2117 05/05/15 0621  GLUCAP 191* 189* 198* 176* 211*    No results found for this or any previous visit (from the past 240 hour(s)).   Studies: No results found.  Scheduled Meds: . aspirin EC  81 mg Oral Daily  . citalopram  10 mg Oral Daily  . enoxaparin (LOVENOX) injection  30 mg Subcutaneous Q24H  . ferrous sulfate  325 mg Oral BID  . hydrocortisone   Rectal TID  . insulin aspart  0-15 Units Subcutaneous TID WC  . insulin glargine  12 Units Subcutaneous q morning - 10a  . isosorbide-hydrALAZINE  0.5 tablet Oral TID  . [START ON 05/06/2015] potassium chloride  20 mEq Oral Daily  . rosuvastatin  5 mg Oral QPM  . torsemide  100 mg Oral BID   Continuous Infusions: . milrinone 0.25 mcg/kg/min (05/05/15 1026)      Time spent: 25 minutes    Hiram Mciver, [facility]  [facility] Pager [phone]. If 7PM-7AM, please contact night-coverage at [email or web address], password [facility] 05/05/2015, 10:33 AM  LOS: 14 days

## 2015-05-05 NOTE — Progress Notes (Signed)
PT Cancellation Note  Patient Details Name: Angela Stuart MRN: [id number] DOB: [birth year]   Cancelled Treatment:    Reason Eval/Treat Not Completed: Fatigue/lethargy limiting ability to participate (pt for d/c to hospice, will sign off)   Fabio Asa 05/05/2015, 2:04 PM Charlotte Crumb, PT DPT  [phone]

## 2015-05-05 NOTE — Progress Notes (Signed)
Ref: GREEN, Lenon Curt, MD   Subjective:  Further renal function deterioration with mild hyponatremia. Denies chest pain or shortness of breath but decreased activity. Afebrile.  Objective:  Vital Signs in the last 24 hours: Temp:  [97.9 F (36.6 C)-98.1 F (36.7 C)] 97.9 F (36.6 C) (01/25 1200) Pulse Rate:  [100-103] 103 (01/25 1200) Cardiac Rhythm:  [-] Sinus tachycardia (01/25 0810) Resp:  [18] 18 (01/25 1200) BP: (87-96)/(51-61) 87/55 mmHg (01/25 1200) SpO2:  [90 %-99 %] 90 % (01/25 1200) Weight:  [75.07 kg (165 lb 8 oz)] 75.07 kg (165 lb 8 oz) (01/25 0512)  Physical Exam: BP Readings from Last 1 Encounters:  05/05/15 87/55    Wt Readings from Last 1 Encounters:  05/05/15 75.07 kg (165 lb 8 oz)    Weight change: 0.953 kg (2 lb 1.6 oz)  HEENT: [place]/AT, Eyes-Light brown, PERL, EOMI, Conjunctiva-Pink, Sclera-Non-icteric Neck: No JVD, No bruit, Trachea midline. Lungs:  Clearing, Bilateral. Cardiac:  Regular rhythm, normal S1 and S2, + S3.  Abdomen:  Soft, non-tender. Extremities:  2 + edema present. No cyanosis. No clubbing. CNS: AxOx3, Cranial nerves grossly intact, moves all 4 extremities. Right handed. Skin: Warm and dry.   Intake/Output from previous day: 01/24 0701 - 01/25 0700 In: 240 [P.O.:240] Out: 400 [Urine:400]    Lab Results: BMET    Component Value Date/Time   NA 133* 05/05/2015 0557   NA 136 05/04/2015 0435   NA 139 05/03/2015 0525   NA 139 05/03/2015 0525   K 4.9 05/05/2015 0557   K 4.8 05/04/2015 0435   K 4.4 05/03/2015 0525   K 4.5 05/03/2015 0525   CL 93* 05/05/2015 0557   CL 98* 05/04/2015 0435   CL 100* 05/03/2015 0525   CL 100* 05/03/2015 0525   CO2 27 05/05/2015 0557   CO2 27 05/04/2015 0435   CO2 27 05/03/2015 0525   CO2 27 05/03/2015 0525   GLUCOSE 219* 05/05/2015 0557   GLUCOSE 198* 05/04/2015 0435   GLUCOSE 218* 05/03/2015 0525   GLUCOSE 216* 05/03/2015 0525   BUN 67* 05/05/2015 0557   BUN 56* 05/04/2015 0435   BUN 50*  05/03/2015 0525   BUN 50* 05/03/2015 0525   CREATININE 2.02* 05/05/2015 0557   CREATININE 1.64* 05/04/2015 0435   CREATININE 1.35* 05/03/2015 0525   CREATININE 1.33* 05/03/2015 0525   CALCIUM 9.7 05/05/2015 0557   CALCIUM 9.5 05/04/2015 0435   CALCIUM 9.5 05/03/2015 0525   CALCIUM 9.4 05/03/2015 0525   GFRNONAA 22* 05/05/2015 0557   GFRNONAA 28* 05/04/2015 0435   GFRNONAA 36* 05/03/2015 0525   GFRNONAA 36* 05/03/2015 0525   GFRAA 25* 05/05/2015 0557   GFRAA 33* 05/04/2015 0435   GFRAA 41* 05/03/2015 0525   GFRAA 42* 05/03/2015 0525   CBC    Component Value Date/Time   WBC 8.8 05/03/2015 0525   RBC 4.15 05/03/2015 0525   HGB 11.6* 05/03/2015 0525   HCT 36.7 05/03/2015 0525   PLT 122* 05/03/2015 0525   MCV 88.4 05/03/2015 0525   MCH 28.0 05/03/2015 0525   MCHC 31.6 05/03/2015 0525   RDW 15.9* 05/03/2015 0525   LYMPHSABS 1.5 04/21/2015 1938   MONOABS 0.5 04/21/2015 1938   EOSABS 0.2 04/21/2015 1938   BASOSABS 0.0 04/21/2015 1938   HEPATIC Function Panel  Recent Labs  04/24/15 1151  PROT 6.3*   HEMOGLOBIN A1C No components found for: HGA1C,  MPG CARDIAC ENZYMES Lab Results  Component Value Date   TROPONINI 0.13* 04/22/2015  TROPONINI 0.15* 04/22/2015   TROPONINI 0.16* 04/21/2015   BNP No results for input(s): PROBNP in the last 8760 hours. TSH  Recent Labs  04/24/15 1151  TSH 5.028*   CHOLESTEROL No results for input(s): CHOL in the last 8760 hours.  Scheduled Meds: . aspirin EC  81 mg Oral Daily  . citalopram  10 mg Oral Daily  . enoxaparin (LOVENOX) injection  30 mg Subcutaneous Q24H  . ferrous sulfate  325 mg Oral BID  . hydrocortisone   Rectal TID  . insulin aspart  0-15 Units Subcutaneous TID WC  . insulin glargine  12 Units Subcutaneous q morning - 10a  . isosorbide-hydrALAZINE  0.5 tablet Oral TID  . [START ON 05/06/2015] potassium chloride  20 mEq Oral Daily  . rosuvastatin  5 mg Oral QPM  . torsemide  100 mg Oral BID   Continuous  Infusions: . milrinone 0.25 mcg/kg/min (05/05/15 1310)   PRN Meds:.sodium chloride, acetaminophen, benzonatate, clonazePAM, midazolam, ondansetron (ZOFRAN) IV, oxyCODONE-acetaminophen, polyvinyl alcohol, promethazine, sodium chloride, sodium chloride  Assessment/Plan: Acute on chronic left heart systolic failure Ischemic cardiomyopathy Acute on chronic renal failure Abnormal troponin-I from renal insufficiency or demand ischemia DM, II Dyslipidemia Depression Hypothyroidism unlikely with elevated free T 4 and borderline TSH elevation in sick patient. Hypoalbuminemia Epistaxis-improving Thrombocytopenia, mild  Awaiting SNF or palliative care.    LOS: 14 days    Orpah Cobb  MD  05/05/2015, 2:12 PM

## 2015-05-05 NOTE — Progress Notes (Signed)
CSW order is in place to initiate a hospice referral to [facility]. One of her sons lives in [place], will be in [place] by the 27th. The other son- Alecia Lemming- lives in [place].  Son from [place] He does not want her moved to residential hospice until he gets to [place] but is ok with referral.  CSW spoke with [facility] Liaison  [phone] re: above. She indicated that she would initiate referral process either this afternoon or tomorrow. They currently do not have any vacancies at [facility] but that this could change by the 27th.   Message left for son Alecia Lemming to call back re: residential home hospice placement process as will need back up choices in case [facility] does not have any vacancies when ready to leave the hospital.  Lupita Leash T. Jaci Lazier, [place] [phone]

## 2015-05-06 DIAGNOSIS — R627 Adult failure to thrive: Secondary | ICD-10-CM

## 2015-05-06 LAB — BASIC METABOLIC PANEL
Anion gap: 12 (ref 5–15)
BUN: 71 mg/dL — AB (ref 6–20)
CALCIUM: 9.5 mg/dL (ref 8.9–10.3)
CHLORIDE: 95 mmol/L — AB (ref 101–111)
CO2: 28 mmol/L (ref 22–32)
CREATININE: 2.16 mg/dL — AB (ref 0.44–1.00)
GFR, EST AFRICAN AMERICAN: 23 mL/min — AB (ref >60)
GFR, EST NON AFRICAN AMERICAN: 20 mL/min — AB (ref >60)
Glucose, Bld: 132 mg/dL — ABNORMAL HIGH (ref 65–99)
Potassium: 4.1 mmol/L (ref 3.5–5.1)
SODIUM: 135 mmol/L (ref 135–145)

## 2015-05-06 LAB — CARBOXYHEMOGLOBIN
Carboxyhemoglobin: 1.7 % — ABNORMAL HIGH (ref 0.5–1.5)
METHEMOGLOBIN: 0.5 % (ref 0.0–1.5)
O2 SAT: 57.8 %
Total hemoglobin: 11.4 g/dL — ABNORMAL LOW (ref 12.0–16.0)

## 2015-05-06 LAB — GLUCOSE, CAPILLARY
GLUCOSE-CAPILLARY: 140 mg/dL — AB (ref 65–99)
Glucose-Capillary: 151 mg/dL — ABNORMAL HIGH (ref 65–99)

## 2015-05-06 MED ORDER — CLONAZEPAM 0.5 MG PO TABS: 0.5000 mg | ORAL_TABLET | Freq: Three times a day (TID) | ORAL | Status: AC | PRN

## 2015-05-06 MED ORDER — TORSEMIDE 100 MG PO TABS: 100.0000 mg | ORAL_TABLET | Freq: Two times a day (BID) | ORAL | Status: AC

## 2015-05-06 MED ORDER — MAGNESIUM SULFATE IN D5W 10-5 MG/ML-% IV SOLN
1.0000 g | Freq: Once | INTRAVENOUS | Status: AC
  Administered 2015-05-06: 1 g via INTRAVENOUS
  Filled 2015-05-06: qty 100

## 2015-05-06 MED ORDER — POTASSIUM CHLORIDE CRYS ER 20 MEQ PO TBCR: 20.0000 meq | EXTENDED_RELEASE_TABLET | Freq: Every day | ORAL | Status: AC

## 2015-05-06 MED ORDER — HEPARIN SOD (PORK) LOCK FLUSH 100 UNIT/ML IV SOLN
250.0000 [IU] | INTRAVENOUS | Status: AC | PRN
Start: 2015-05-06 — End: 2015-05-06
  Administered 2015-05-06: 250 [IU]

## 2015-05-06 MED ORDER — OXYCODONE HCL 5 MG PO TABS: 5.0000 mg | ORAL_TABLET | ORAL | Status: DC | PRN

## 2015-05-06 MED ORDER — OXYCODONE HCL 5 MG PO TABS: 5.0000 mg | ORAL_TABLET | ORAL | Status: AC | PRN

## 2015-05-06 MED ORDER — CLONAZEPAM 0.5 MG PO TABS: 0.5000 mg | ORAL_TABLET | Freq: Three times a day (TID) | ORAL | Status: DC | PRN

## 2015-05-06 NOTE — Progress Notes (Signed)
Patient ID: Angela Stuart, female   DOB: [birth year], 82 y.o.   MRN: [id number]  Advanced Heart Failure Rounding Note  PCP: None per pt. Recently moved to [place] from [place].   Subjective:   Yesterday she was transitioned to comfort care. Plan for family meeting today.   Off milrinone. Weight up a pound. Denies SOB.      Objective:   Weight Range: 166 lb (75.297 kg) Body mass index is 26.81 kg/(m^2).   Vital Signs:   Temp:  [97.5 F (36.4 C)-98.2 F (36.8 C)] 97.5 F (36.4 C) (01/26 0517) Pulse Rate:  [103-105] 104 (01/26 0517) Resp:  [18-20] 18 (01/26 0517) BP: (84-89)/(51-56) 84/55 mmHg (01/26 0805) SpO2:  [90 %-95 %] 95 % (01/26 0517) Weight:  [166 lb (75.297 kg)] 166 lb (75.297 kg) (01/26 0400) Last BM Date: 05/02/15  Weight change: Filed Weights   05/04/15 0521 05/05/15 0512 05/06/15 0400  Weight: 163 lb 6.4 oz (74.118 kg) 165 lb 8 oz (75.07 kg) 166 lb (75.297 kg)    Intake/Output:   Intake/Output Summary (Last 24 hours) at 05/06/15 1003 Last data filed at 05/06/15 0915  Gross per 24 hour  Intake    480 ml  Output   1050 ml  Net   -570 ml     Physical Exam: General: Chronically ill and elderly appearing. NAD. In bed. Son at bed side. Marland Kitchen  HEENT: normal Neck: supple. JVP to jaw +. Carotids 2+ bilat; no bruits. No thyromegaly or nodule noted Cor: PMI nondisplaced. RRR, 3/6 TR, 2/6 MR murmur. +S3 Lungs: Basilar crackles, normal effort Abdomen: soft, NT, mild/moderately distended, no HSM. No bruits or masses. +BS Extremities: no cyanosis, clubbing, rash. R and LLE unna boots.  R and L thigh 2+ edema. Neuro: alert & orientedx3, cranial nerves grossly intact. moves all 4 extremities w/o difficulty. Affect pleasant  Telemetry: NSR 80s with occasional PVCs.   Labs: CBC No results for input(s): WBC, NEUTROABS, HGB, HCT, MCV, PLT in the last 72 hours. Basic Metabolic Panel  Recent Labs  05/05/15 0557 05/06/15 0432  NA 133* 135  K 4.9 4.1  CL 93* 95*  CO2 27 28   GLUCOSE 219* 132*  BUN 67* 71*  CREATININE 2.02* 2.16*  CALCIUM 9.7 9.5   Liver Function Tests No results for input(s): AST, ALT, ALKPHOS, BILITOT, PROT, ALBUMIN in the last 72 hours. No results for input(s): LIPASE, AMYLASE in the last 72 hours. Cardiac Enzymes No results for input(s): CKTOTAL, CKMB, CKMBINDEX, TROPONINI in the last 72 hours.  BNP: BNP (last 3 results)  Recent Labs  04/21/15 1939 04/29/15 0320 05/03/15 0525  BNP >4500.0* >4500.0* >4500.0*    ProBNP (last 3 results) No results for input(s): PROBNP in the last 8760 hours.   D-Dimer No results for input(s): DDIMER in the last 72 hours. Hemoglobin A1C No results for input(s): HGBA1C in the last 72 hours. Fasting Lipid Panel No results for input(s): CHOL, HDL, LDLCALC, TRIG, CHOLHDL, LDLDIRECT in the last 72 hours. Thyroid Function Tests No results for input(s): TSH, T4TOTAL, T3FREE, THYROIDAB in the last 72 hours.  Invalid input(s): FREET3  Other results:     Imaging/Studies:  No results found.  Latest Echo  Latest Cath   Medications:     Scheduled Medications: . aspirin EC  81 mg Oral Daily  . citalopram  10 mg Oral Daily  . enoxaparin (LOVENOX) injection  30 mg Subcutaneous Q24H  . ferrous sulfate  325 mg Oral BID  . hydrocortisone  Rectal TID  . insulin aspart  0-15 Units Subcutaneous TID WC  . insulin glargine  12 Units Subcutaneous q morning - 10a  . isosorbide-hydrALAZINE  0.5 tablet Oral TID  . magnesium sulfate 1 - 4 g bolus IVPB  1 g Intravenous Once  . potassium chloride  20 mEq Oral Daily  . rosuvastatin  5 mg Oral QPM  . torsemide  100 mg Oral BID    Infusions:    PRN Medications: sodium chloride, acetaminophen, benzonatate, clonazePAM, midazolam, ondansetron (ZOFRAN) IV, oxyCODONE-acetaminophen, polyvinyl alcohol, promethazine, sodium chloride, sodium chloride   Assessment   1. A/C systolic HF: echo 04/23/15 initially read as 35-40%, looks more like 20-25%.  Myoview 04/27/15 EF 21%.  She has low output  2. CAD s/p CABG x 4 1971: Small area of anterior ischemia only on Cardiolite this admission.  3. AKI on CKD stage III 4. Endocrine 5. S/p Fall 04/28/15 6. DNR/DNI   Plan  Family meeting today. Comfort Care with transfer to [facility] today.   Milrinone stopped this morning. Continue torsemide 100 mg twice a day + 20 meq potassium. Stop bidil with hypotension. Stop crestor. Remove [employer].   Can leave PICC per Hospice.   Disposition: To [facility] today.    Amy Clegg,NP-C  10:03 AM   Patient seen and examined with Tonye Becket, NP. We discussed all aspects of the encounter. I agree with the assessment and plan as stated above.   She has transitioned to full comfort care for end-stage HF. Transferring to [facility]. Agree with above.   Bensimhon, Daniel,MD 10:07 PM

## 2015-05-06 NOTE — Discharge Summary (Signed)
Physician Discharge Summary  Angela Stuart IWP:[id number] DOB: 04-30-1931 DOA: 04/21/2015  PCP: Estill Dooms, MD  Admit date: 04/21/2015 Discharge date: 05/06/2015  Time spent: Less than 30 minutes  Recommendations for Outpatient Follow-up:  1. Patient being discharged to [facility] for end of life care.  Discharge Diagnoses:  Principal Problem:   CHF, acute on chronic (HCC) Active Problems:   Renal insufficiency   DM2 (diabetes mellitus, type 2) (HCC)   Acute on chronic left systolic heart failure (HCC)   Acute kidney injury (HCC)   Hypokalemia   Acute on chronic congestive heart failure ([place])   Palliative care encounter   Goals of care, counseling/discussion   Pain   Discharge Condition: Improved & Stable  Diet recommendation: Regular diet/comfort feeds.  Filed Weights   05/04/15 0521 05/05/15 0512 05/06/15 0400  Weight: 74.118 kg (163 lb 6.4 oz) 75.07 kg (165 lb 8 oz) 75.297 kg (166 lb)    History of present illness & Hospital course:  82 year old female with history of chronic systolic CHF (LVEF 50-53 percent-per cardiology looks more like 20-25 percent), CAD, CABG, DM, HTN, recently moved from [place] to the [place] area, admitted to [facility] on 04/21/15 with several week history of worsening peripheral edema, generalized weakness and fatigue to a point were she could not walk. Home dose of Lasix was not helping her. She was hospitalized for acute on chronic systolic CHF. Cardiology and advanced heart failure team were consulted. She was treated aggressively including diuretics and milrinone drip. Myoview 04/27/15 showed EF 21% and high risk stress findings as below. Despite aggressive care, patient progressively declined with worsening renal insufficiency, poor response to high-dose diuretics and hypotension. Eventually palliative care team was consulted and met with patient and family and transitioned to complete comfort care. I discussed with advanced heart failure team/Ms.  Amy Clegg and palliative care team Dr. Loistine Chance today. Medications have been adjusted to focus on comfort and medications nonessential to comfort have been discontinued. Patient and family have decided for patient to go to [facility] for end-of-life care. Poor overall prognosis: Days to week.  Consultations:  Cardiology  Advanced heart failure team  Palliative care team  Procedures:  PICC line   Discharge Exam:  Complaints: Patient somnolent but arousable. "I'm okay". Denies complaints.  Filed Vitals:   05/05/15 2236 05/06/15 0400 05/06/15 0517 05/06/15 0805  BP: 89/51  84/56 84/55  Pulse: 105  104   Temp: 98.2 F (36.8 C)  97.5 F (36.4 C)   TempSrc: Oral  Oral   Resp: 20  18   Height:      Weight:  75.297 kg (166 lb)    SpO2: 95%  95%     General exam: Small built, frail elderly chronically ill-looking female lying comfortably propped up in bed. Respiratory system: Reduced breath sounds in the bases but rest of the lung fields seem clear to auscultation. No increased work of breathing. Cardiovascular system: S1 & S2 heard, RRR. No JVD, murmurs, gallops, clicks or pedal edema. Telemetry: SR in the 100s-ST in the 110s. Gastrointestinal system: Abdomen is nondistended, soft and nontender. Normal bowel sounds heard. Central nervous system: Somnolent but easily arousable and oriented to self. No focal neurological deficits. Extremities: Moving all extremities. Bilateral lower extremity Una boots  Discharge Instructions      Discharge Instructions    (HEART FAILURE PATIENTS) Call MD:  Anytime you have any of the following symptoms: 1) 3 pound weight gain in 24 hours or 5  pounds in 1 week 2) shortness of breath, with or without a dry hacking cough 3) swelling in the hands, feet or stomach 4) if you have to sleep on extra pillows at night in order to breathe.    Complete by:  As directed      Call MD for:  difficulty breathing, headache or visual disturbances    Complete  by:  As directed      Call MD for:  extreme fatigue    Complete by:  As directed      Call MD for:  persistant dizziness or light-headedness    Complete by:  As directed      Call MD for:  persistant nausea and vomiting    Complete by:  As directed      Call MD for:  redness, tenderness, or signs of infection (pain, swelling, redness, odor or green/yellow discharge around incision site)    Complete by:  As directed      Call MD for:  severe uncontrolled pain    Complete by:  As directed      Call MD for:  temperature >100.4    Complete by:  As directed      Diet general    Complete by:  As directed      Increase activity slowly    Complete by:  As directed             Medication List    STOP taking these medications        acetaminophen 325 MG tablet  Commonly known as:  TYLENOL     aspirin EC 81 MG tablet     bumetanide 1 MG tablet  Commonly known as:  BUMEX     carvedilol 10 MG 24 hr capsule  Commonly known as:  COREG CR     ferrous sulfate 325 (65 FE) MG EC tablet     metFORMIN 500 MG tablet  Commonly known as:  GLUCOPHAGE     rosuvastatin 10 MG tablet  Commonly known as:  CRESTOR      TAKE these medications        citalopram 10 MG tablet  Commonly known as:  CELEXA  Take 10 mg by mouth daily.     clonazePAM 0.5 MG tablet  Commonly known as:  KLONOPIN  Take 1 tablet (0.5 mg total) by mouth 3 (three) times daily as needed (anxiety).     oxyCODONE 5 MG immediate release tablet  Commonly known as:  Oxy IR/ROXICODONE  Take 1 tablet (5 mg total) by mouth every 3 (three) hours as needed for moderate pain or severe pain.     potassium chloride SA 20 MEQ tablet  Commonly known as:  K-DUR,KLOR-CON  Take 1 tablet (20 mEq total) by mouth daily.     torsemide 100 MG tablet  Commonly known as:  DEMADEX  Take 1 tablet (100 mg total) by mouth 2 (two) times daily.          The results of significant diagnostics from this hospitalization (including imaging,  microbiology, ancillary and laboratory) are listed below for reference.    Significant Diagnostic Studies: Dg Chest 2 View  04/21/2015  CLINICAL DATA:  Lower extremity swelling and edema for 2 months. EXAM: CHEST  2 VIEW COMPARISON:  None. FINDINGS: Low lung volumes. The lungs are clear wiithout focal pneumonia, edema, pneumothorax or pleural effusion. Interstitial markings are diffusely coarsened with chronic features. There is pulmonary vascular congestion without overt pulmonary edema. The  cardio pericardial silhouette is enlarged. Patient is status post median sternotomy Bones are diffusely demineralized. IMPRESSION: Enlargement of the cardiopericardial silhouette. Cardiomegaly versus pericardial effusion. Vascular congestion without overt edema. Electronically Signed   By: Misty Stanley M.D.   On: 04/21/2015 19:28   US Renal  04/24/2015  CLINICAL DATA:  Chronic kidney disease. EXAM: RENAL / URINARY TRACT ULTRASOUND COMPLETE COMPARISON:  None. FINDINGS: Right Kidney: Length: 10.0 cm. Normal parenchymal echogenicity. 2.7 cm cortical cyst arises from the upper pole. No other renal masses, no stones and no hydronephrosis. Left Kidney: Length: 10.2 cm. Echogenicity within normal limits. No mass or hydronephrosis visualized. Bladder: Appears normal for degree of bladder distention. Note is made of small to moderate amount of ascites. Liver appears nodular with a coarsened echotexture suggesting cirrhosis. IMPRESSION: 1. Normal renal sizes and parenchymal echogenicity. No hydronephrosis. Right renal cyst. No other masses. 2. Ascites.  Liver appearance raises suspicion for cirrhosis. Electronically Signed   By: Lajean Manes M.D.   On: 04/24/2015 11:56   Nm Myocar Multi W/spect W/wall Motion / Ef  04/27/2015  CLINICAL DATA:  History of Coronary artery disease, bypass surgery, myocardial infarctions, diabetes, hypertension and CHF. EXAM: MYOCARDIAL IMAGING WITH SPECT (REST AND PHARMACOLOGIC-STRESS) GATED LEFT  VENTRICULAR WALL MOTION STUDY LEFT VENTRICULAR EJECTION FRACTION TECHNIQUE: Standard myocardial SPECT imaging was performed after resting intravenous injection of 10 mCi Tc-55msestamibi. Subsequently, intravenous infusion of Lexiscan was performed under the supervision of the Cardiology staff. At peak effect of the drug, 30 mCi Tc-939mestamibi was injected intravenously and standard myocardial SPECT imaging was performed. Quantitative gated imaging was also performed to evaluate left ventricular wall motion, and estimate left ventricular ejection fraction. COMPARISON:  None. FINDINGS: Perfusion: Small reversible defect involving the low anterior wall consistent with ischemia. Wall Motion: Left ventricular dilatation and global hypokinesis. Left Ventricular Ejection Fraction: 21 % End diastolic volume 18702l End systolic volume 14637l IMPRESSION: 1. Low anterior wall ischemia. 2. Global hypokinesis. 3. Left ventricular ejection fraction 21% 4. High-risk stress test findings*. *2012 Appropriate Use Criteria for Coronary Revascularization Focused Update: J Am Coll Cardiol. 208588;50(2):774-128http://content.onairportbarriers.comspx?articleid=[email or web address] Electronically Signed   By: P.Marijo Sanes.D.   On: 04/27/2015 11:59   Dg Chest Port 1 View  04/29/2015  CLINICAL DATA:  Right PICC line placement EXAM: PORTABLE CHEST 1 VIEW COMPARISON:  04/21/2015 FINDINGS: Cardiomegaly again noted. Status post median sternotomy. Atherosclerotic calcifications of thoracic aorta again noted. Again noted left basilar atelectasis. There is right arm PICC line with tip in right atrium. No pneumothorax. For distal SVC position the PICC line should be retracted about 2 cm. No infiltrate or pulmonary edema. IMPRESSION: Cardiomegaly again noted. No segmental infiltrate or pulmonary edema.There is right arm PICC line with tip in right atrium. No pneumothorax. For distal SVC position the PICC line should be retracted about 2 cm.  Electronically Signed   By: LiLahoma Crocker.D.   On: 04/29/2015 14:36   Dg Hip Unilat With Pelvis 2-3 Views Right  04/28/2015  CLINICAL DATA:  89ear old female who fell on steps this morning. Posterior right hip pain. Initial encounter. EXAM: DG HIP (WITH OR WITHOUT PELVIS) 2-3V RIGHT COMPARISON:  None. FINDINGS: Osteopenia. Extensive aortic, iliac, and femoral artery calcified atherosclerosis. Femoral heads are normally located. No pelvic fracture identified. Proximal left femur appears grossly intact. Proximal right femur appears intact. SI joints within normal limits. IMPRESSION: No acute fracture or dislocation identified about the right hip per pelvis. If occult hip fracture is  suspected or if the patient is unable to weightbear, MRI is the preferred modality for further evaluation. Electronically Signed   By: Genevie Ann M.D.   On: 04/28/2015 12:14    Microbiology: No results found for this or any previous visit (from the past 240 hour(s)).   Labs: Basic Metabolic Panel:  Recent Labs Lab 05/01/15 0450 05/02/15 0500 05/03/15 0525 05/04/15 0435 05/05/15 0557 05/06/15 0432  NA 142 140 139  139 136 133* 135  K 3.3* 4.3 4.5  4.4 4.8 4.9 4.1  CL 105 104 100*  100* 98* 93* 95*  CO2 '28 28 27  27 27 27 28  ' GLUCOSE 151* 198* 216*  218* 198* 219* 132*  BUN 41* 43* 50*  50* 56* 67* 71*  CREATININE 1.20* 1.23* 1.33*  1.35* 1.64* 2.02* 2.16*  CALCIUM 9.4 9.3 9.4  9.5 9.5 9.7 9.5  PHOS 2.8 2.6 2.5  --   --   --    Liver Function Tests:  Recent Labs Lab 05/01/15 0450 05/02/15 0500 05/03/15 0525  ALBUMIN 2.8* 2.9* 2.9*   No results for input(s): LIPASE, AMYLASE in the last 168 hours. No results for input(s): AMMONIA in the last 168 hours. CBC:  Recent Labs Lab 05/02/15 0500 05/03/15 0525  WBC 8.5 8.8  HGB 11.8* 11.6*  HCT 36.8 36.7  MCV 88.2 88.4  PLT 123* 122*   Cardiac Enzymes: No results for input(s): CKTOTAL, CKMB, CKMBINDEX, TROPONINI in the last 168  hours. BNP: BNP (last 3 results)  Recent Labs  04/21/15 1939 04/29/15 0320 05/03/15 0525  BNP >4500.0* >4500.0* >4500.0*    ProBNP (last 3 results) No results for input(s): PROBNP in the last 8760 hours.  CBG:  Recent Labs Lab 05/05/15 0621 05/05/15 1201 05/05/15 1648 05/05/15 2233 05/06/15 0644  GLUCAP 211* 188* 190* 132* 140*       Signed:  Vernell Leep, MD, FACP, FHM. [facility] Pager [phone]  If 7PM-7AM, please contact night-coverage [email or web address] Password [facility] 05/06/2015, 11:33 AM

## 2015-05-06 NOTE — Progress Notes (Signed)
Ref: GREEN, Lenon Curt, MD   Subjective:  Feeling weak. Afebrile. + muscle cramps with potassium level of 4.1 and magnesium level low last week..  Objective:  Vital Signs in the last 24 hours: Temp:  [97.5 F (36.4 C)-98.2 F (36.8 C)] 97.5 F (36.4 C) (01/26 0517) Pulse Rate:  [103-105] 104 (01/26 0517) Cardiac Rhythm:  [-] Normal sinus rhythm (01/26 0759) Resp:  [18-20] 18 (01/26 0517) BP: (84-89)/(51-56) 84/55 mmHg (01/26 0805) SpO2:  [90 %-95 %] 95 % (01/26 0517) Weight:  [75.297 kg (166 lb)] 75.297 kg (166 lb) (01/26 0400)  Physical Exam: BP Readings from Last 1 Encounters:  05/06/15 84/55    Wt Readings from Last 1 Encounters:  05/06/15 75.297 kg (166 lb)    Weight change: 0.227 kg (8 oz)  HEENT: [place]/AT, Eyes-Light brown, Conjunctiva-Pink, Sclera-Non-icteric Neck: No JVD, No bruit, Trachea midline. Lungs:  Clearing, Bilateral. Cardiac:  Regular rhythm, normal S1 and S2, + S3.  Abdomen:  Soft, non-tender. Extremities:  2 + edema present. No cyanosis. No clubbing. CNS: AxOx2, Cranial nerves grossly intact, moves all 4 extremities. Right handed. Skin: Warm and dry.   Intake/Output from previous day: 01/25 0701 - 01/26 0700 In: 720 [P.O.:720] Out: 800 [Urine:800]    Lab Results: BMET    Component Value Date/Time   NA 135 05/06/2015 0432   NA 133* 05/05/2015 0557   NA 136 05/04/2015 0435   K 4.1 05/06/2015 0432   K 4.9 05/05/2015 0557   K 4.8 05/04/2015 0435   CL 95* 05/06/2015 0432   CL 93* 05/05/2015 0557   CL 98* 05/04/2015 0435   CO2 28 05/06/2015 0432   CO2 27 05/05/2015 0557   CO2 27 05/04/2015 0435   GLUCOSE 132* 05/06/2015 0432   GLUCOSE 219* 05/05/2015 0557   GLUCOSE 198* 05/04/2015 0435   BUN 71* 05/06/2015 0432   BUN 67* 05/05/2015 0557   BUN 56* 05/04/2015 0435   CREATININE 2.16* 05/06/2015 0432   CREATININE 2.02* 05/05/2015 0557   CREATININE 1.64* 05/04/2015 0435   CALCIUM 9.5 05/06/2015 0432   CALCIUM 9.7 05/05/2015 0557   CALCIUM 9.5  05/04/2015 0435   GFRNONAA 20* 05/06/2015 0432   GFRNONAA 22* 05/05/2015 0557   GFRNONAA 28* 05/04/2015 0435   GFRAA 23* 05/06/2015 0432   GFRAA 25* 05/05/2015 0557   GFRAA 33* 05/04/2015 0435   CBC    Component Value Date/Time   WBC 8.8 05/03/2015 0525   RBC 4.15 05/03/2015 0525   HGB 11.6* 05/03/2015 0525   HCT 36.7 05/03/2015 0525   PLT 122* 05/03/2015 0525   MCV 88.4 05/03/2015 0525   MCH 28.0 05/03/2015 0525   MCHC 31.6 05/03/2015 0525   RDW 15.9* 05/03/2015 0525   LYMPHSABS 1.5 04/21/2015 1938   MONOABS 0.5 04/21/2015 1938   EOSABS 0.2 04/21/2015 1938   BASOSABS 0.0 04/21/2015 1938   HEPATIC Function Panel  Recent Labs  04/24/15 1151  PROT 6.3*   HEMOGLOBIN A1C No components found for: HGA1C,  MPG CARDIAC ENZYMES Lab Results  Component Value Date   TROPONINI 0.13* 04/22/2015   TROPONINI 0.15* 04/22/2015   TROPONINI 0.16* 04/21/2015   BNP No results for input(s): PROBNP in the last 8760 hours. TSH  Recent Labs  04/24/15 1151  TSH 5.028*   CHOLESTEROL No results for input(s): CHOL in the last 8760 hours.  Scheduled Meds: . aspirin EC  81 mg Oral Daily  . citalopram  10 mg Oral Daily  . enoxaparin (LOVENOX) injection  30 mg Subcutaneous Q24H  . ferrous sulfate  325 mg Oral BID  . hydrocortisone   Rectal TID  . insulin aspart  0-15 Units Subcutaneous TID WC  . insulin glargine  12 Units Subcutaneous q morning - 10a  . isosorbide-hydrALAZINE  0.5 tablet Oral TID  . magnesium sulfate 1 - 4 g bolus IVPB  1 g Intravenous Once  . potassium chloride  20 mEq Oral Daily  . rosuvastatin  5 mg Oral QPM  . torsemide  100 mg Oral BID   Continuous Infusions:  PRN Meds:.sodium chloride, acetaminophen, benzonatate, clonazePAM, midazolam, ondansetron (ZOFRAN) IV, oxyCODONE-acetaminophen, polyvinyl alcohol, promethazine, sodium chloride, sodium chloride  Assessment/Plan: Acute on chronic left heart systolic failure Ischemic cardiomyopathy Acute on chronic  renal failure Abnormal troponin-I from renal insufficiency or demand ischemia DM, II Dyslipidemia Depression Hypothyroidism unlikely with elevated free T 4 and borderline TSH elevation in sick patient. Hypoalbuminemia Epistaxis-improved Thrombocytopenia, mild  Continue medical treatment. IV magnesium (low magnesium level last week) with level check in AM.     LOS: 15 days    Orpah Cobb  MD  05/06/2015, 9:07 AM

## 2015-05-06 NOTE — Progress Notes (Signed)
Daily Progress Note   Patient Name: Angela Stuart       Date: 05/06/2015 DOB: 1931-11-06  Age: 82 y.o. MRN#: [id number] Attending Physician: Elease Etienne, MD Primary Care Physician: Kimber Relic, MD Admit Date: 04/21/2015  Reason for Consultation/Follow-up: Establishing goals of care  Subjective: Sleeping more, oral intake is minimal to nil at this point Patient awakens, and is able to answer very simple yes/no questions Complains of pain in both legs.  Son and daughter in law are at the bedside, meeting with [facility] liaison Carley Hammed    prognosis appears to be days-1 week or so at this point.  Medication list reviewed. Patient on PO PRN Oxycodone, will increase Klonopin dose and monitor.  Patient will likely transition to Hospice Home today.     Length of Stay: 15 days  Current Medications: Scheduled Meds:  . citalopram  10 mg Oral Daily  . hydrocortisone   Rectal TID  . insulin aspart  0-15 Units Subcutaneous TID WC  . potassium chloride  20 mEq Oral Daily  . torsemide  100 mg Oral BID    Continuous Infusions:    PRN Meds: sodium chloride, acetaminophen, benzonatate, clonazePAM, ondansetron (ZOFRAN) IV, oxyCODONE, polyvinyl alcohol, promethazine, sodium chloride, sodium chloride  Physical Exam: Physical Exam   General: Chronically ill and elderly appearing. NAD HEENT: normal Neck: supple. JVP to jaw CV: RRR, S3 Lungs: Crackles in bases, normal effort.  Abdomen: soft, NT, mild/moderately distended, no HSM. No bruits or masses. +BS Extremities: 2+ edema Neuro: alert & oriented, no gross deficits            Vital Signs: BP 84/55 mmHg  Pulse 104  Temp(Src) 97.5 F (36.4 C) (Oral)  Resp 18  Ht  (1.676 m)  Wt 75.297 kg (166 lb)  BMI 26.81 kg/m2  SpO2 95% SpO2:  SpO2: 95 % O2 Device: O2 Device: Not Delivered O2 Flow Rate:    Intake/output summary:   Intake/Output Summary (Last 24 hours) at 05/06/15 1102 Last data filed at 05/06/15 0915  Gross per 24 hour  Intake    480 ml  Output   1050 ml  Net   -570 ml   LBM: Last BM Date: 05/02/15 Baseline Weight: Weight: 77.973 kg (171 lb 14.4 oz) Most recent weight: Weight: 75.297 kg (166 lb) (  scale A)       Palliative Assessment/Data: Flowsheet Rows        Most Recent Value   Intake Tab    Referral Department  Cardiology   Unit at Time of Referral  Cardiac/Telemetry Unit   Palliative Care Primary Diagnosis  Cardiac   Date Notified  04/29/15   Palliative Care Type  New Palliative care   Reason for referral  Clarify Goals of Care   Date of Admission  04/21/15   Date first seen by Palliative Care  04/30/15   # of days Palliative referral response time  1 Day(s)   # of days IP prior to Palliative referral  8   Clinical Assessment    Palliative Performance Scale Score  50%   Pain Max last 24 hours  6   Pain Min Last 24 hours  0   Psychosocial & Spiritual Assessment    Palliative Care Outcomes    Patient/Family meeting held?  Yes   Who was at the meeting?  Patient, her son, and her daughter-in-law   Palliative Care Outcomes  Clarified goals of care, Completed durable DNR   Patient/Family wishes: Interventions discontinued/not started   Mechanical Ventilation, BiPAP      Additional Data Reviewed: CBC    Component Value Date/Time   WBC 8.8 05/03/2015 0525   RBC 4.15 05/03/2015 0525   HGB 11.6* 05/03/2015 0525   HCT 36.7 05/03/2015 0525   PLT 122* 05/03/2015 0525   MCV 88.4 05/03/2015 0525   MCH 28.0 05/03/2015 0525   MCHC 31.6 05/03/2015 0525   RDW 15.9* 05/03/2015 0525   LYMPHSABS 1.5 04/21/2015 1938   MONOABS 0.5 04/21/2015 1938   EOSABS 0.2 04/21/2015 1938   BASOSABS 0.0 04/21/2015 1938    CMP     Component Value Date/Time   NA 135 05/06/2015 0432   K 4.1 05/06/2015 0432     CL 95* 05/06/2015 0432   CO2 28 05/06/2015 0432   GLUCOSE 132* 05/06/2015 0432   BUN 71* 05/06/2015 0432   CREATININE 2.16* 05/06/2015 0432   CALCIUM 9.5 05/06/2015 0432   PROT 6.3* 04/24/2015 1151   ALBUMIN 2.9* 05/03/2015 0525   AST 27 04/24/2015 1151   ALT 13* 04/24/2015 1151   ALKPHOS 70 04/24/2015 1151   BILITOT 1.2 04/24/2015 1151   GFRNONAA 20* 05/06/2015 0432   GFRAA 23* 05/06/2015 0432       Problem List:  Patient Active Problem List   Diagnosis Date Noted  . Pain   . Palliative care encounter   . Goals of care, counseling/discussion   . Acute on chronic congestive heart failure (HCC)   . Acute on chronic left systolic heart failure (HCC)   . Acute kidney injury (HCC)   . Hypokalemia   . CHF, acute on chronic (HCC) 04/21/2015  . Renal insufficiency 04/21/2015  . DM2 (diabetes mellitus, type 2) (HCC) 04/21/2015     Palliative Care Assessment & Plan    1.Code Status:  DNR    Code Status Orders        Start     Ordered   04/29/15 1208  Do not attempt resuscitation (DNR)   Continuous    Question Answer Comment  In the event of cardiac or respiratory ARREST Do not call a "code blue"   In the event of cardiac or respiratory ARREST Do not perform Intubation, CPR, defibrillation or ACLS   In the event of cardiac or respiratory ARREST Use medication by any route,  position, wound care, and other measures to relive pain and suffering. May use oxygen, suction and manual treatment of airway obstruction as needed for comfort.      04/29/15 1208    Code Status History    Date Active Date Inactive Code Status Order ID Comments User Context   04/21/2015  9:19 PM 04/29/2015 12:08 PM Full Code [id number]  Hillary Bow, DO ED    Advance Directive Documentation        Most Recent Value   Type of Advance Directive  Living will   Pre-existing out of facility DNR order (yellow form or pink MOST form)     "MOST" Form in Place?         2. Goals of  Care/Additional Recommendations:  Maintaining comfort measures, hospice home today    3. Symptom Management:       continue PO PRN Oxycodone IR  Increase dose of Klonopin  4. Palliative Prophylaxis:   Aspiration, Bowel Regimen and Delirium Protocol  5. Prognosis:   Days to 1 week or so 6. Discharge Planning:  ?residential hospice   Care plan was discussed with patient, her son Alecia Lemming in the room, Forrestine Him from Hospice Thank you for allowing the Palliative Medicine Team to assist in the care of this patient.   Time In: 10 Time Out: 10 Total Time 30  Prolonged Time Billed no        Rosalin Hawking, MD  05/06/2015, 11:02 AM  Please contact Palliative Medicine Team phone at [phone] for questions and concerns.

## 2015-05-06 NOTE — Care Management Important Message (Signed)
Important Message  Patient Details  Name: Angela Stuart MRN: [id number] Date of Birth: 03/29/1932   Medicare Important Message Given:  Yes    Celes Dedic P Lorrie Gargan 05/06/2015, 2:28 PM

## 2015-05-06 NOTE — Progress Notes (Signed)
Pt transferred to:  [facility] Anticipated date of transfer: 1/26//17 Transported [id number] (PTAR) Time Tentatively Scheduled for: 1:30 PM Family notified: Son:  Xavier Fournier Report # given to nursing to call report.  DC summary has been sent to [facility].  Patient's son Alecia Lemming and wife present and are agreeable to d/c.Patient will open her eyes and responds to voice but did not respond verbally.  No further CSW needs identified. CSW signing off.  Lovette Cliche, LCSW [phone]

## 2015-05-06 NOTE — Consult Note (Signed)
Angela Stuart: Received request from [place] for request for [facility] room today rather than tomorrow as initially requested. Made CSW aware patient is eligible and room is available today if family confirms they would like to transfer today. Received follow up call from Butch Penny indicating family does want to transfer today. Met with patient's son and d-i-l at bedside to complete registration paper work. Dr. Orpah Melter to assume care per family request.   RN Please call report to [phone].  Please fax discharge summary to [phone].  Family is requesting transport for as soon as possible so patient can get settled before rest of family arrives.   Thank you.  Angela Stuart, [place]

## 2015-05-12 DEATH — deceased

## 2015-06-18 ENCOUNTER — Ambulatory Visit: Payer: Self-pay | Admitting: Internal Medicine

## 2017-01-15 IMAGING — DX DG HIP (WITH OR WITHOUT PELVIS) 2-3V*R*
3 series · 3 of 3 positions shown · non-contrast
Comparison: None.

CLINICAL DATA: 83-year-old female who fell on steps this morning.
Posterior right hip pain. Initial encounter.

EXAM:
DG HIP (WITH OR WITHOUT PELVIS) 2-3V RIGHT

[pelvis ap]
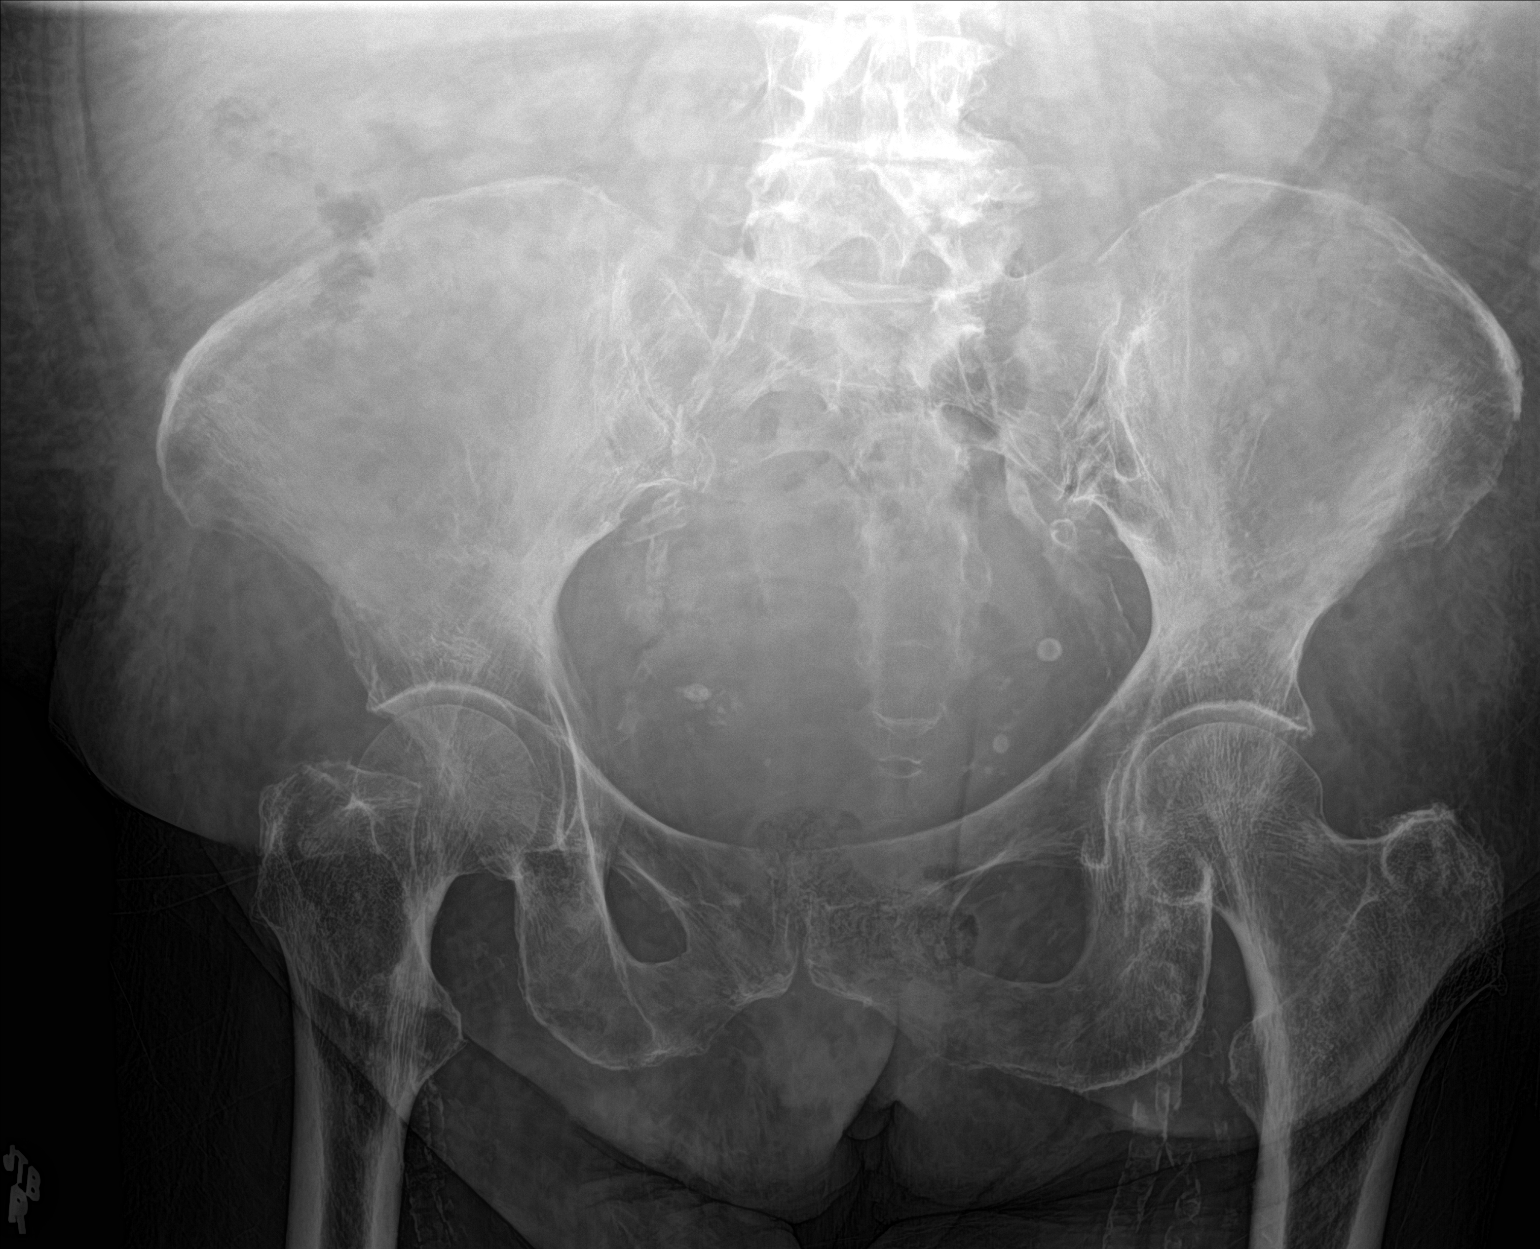

[hip ap]
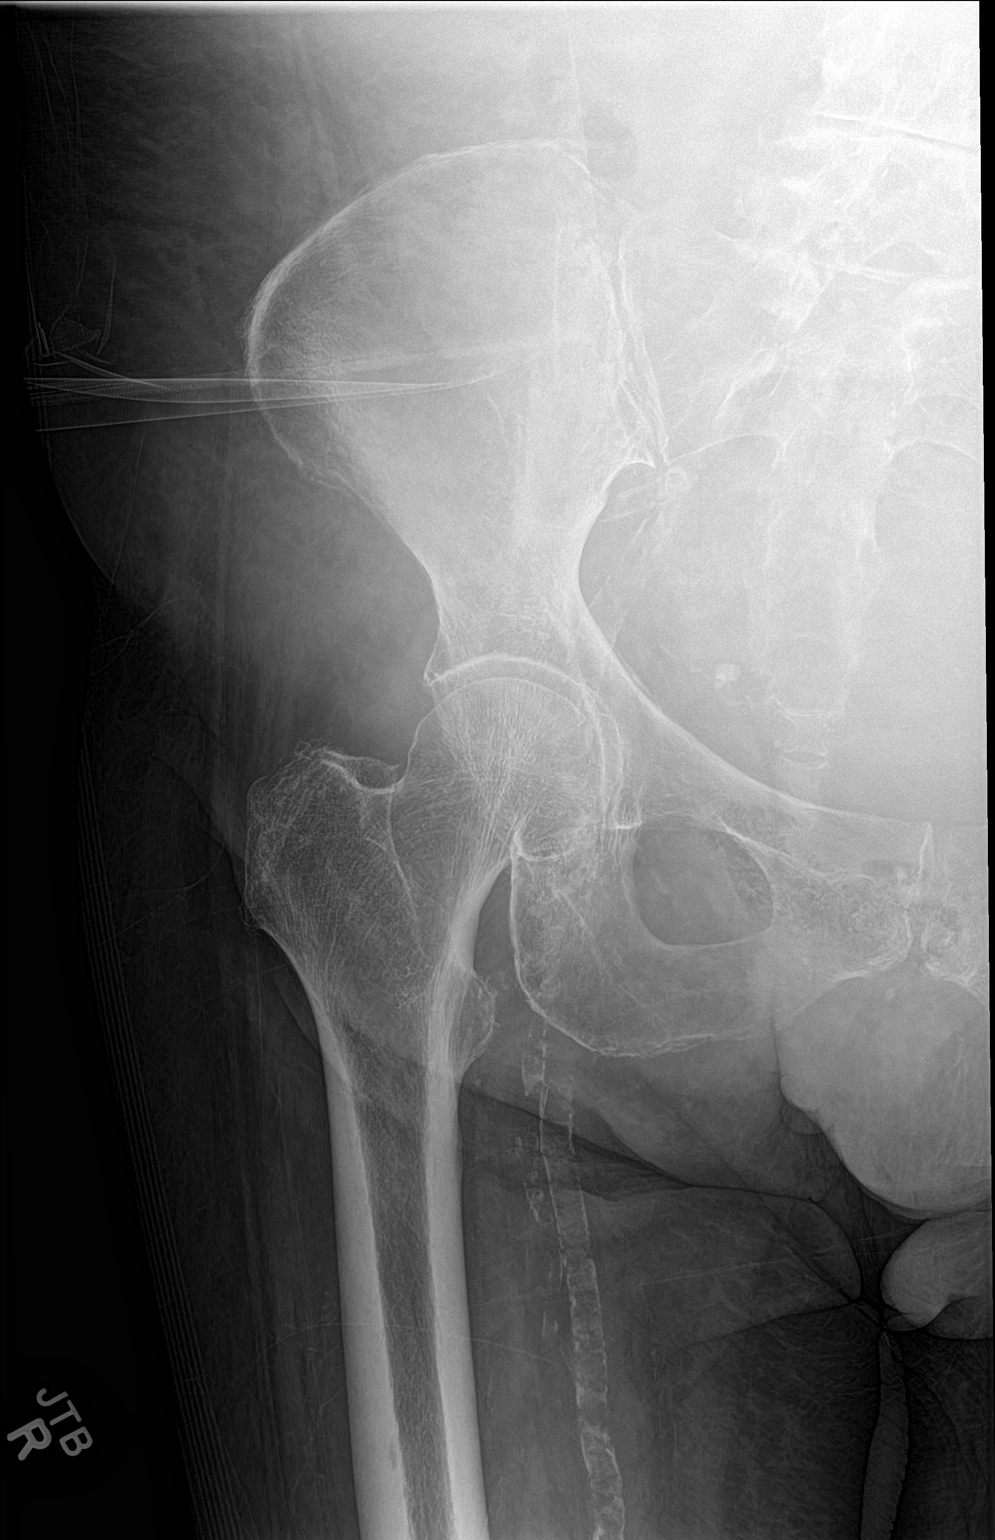

[hip lat]
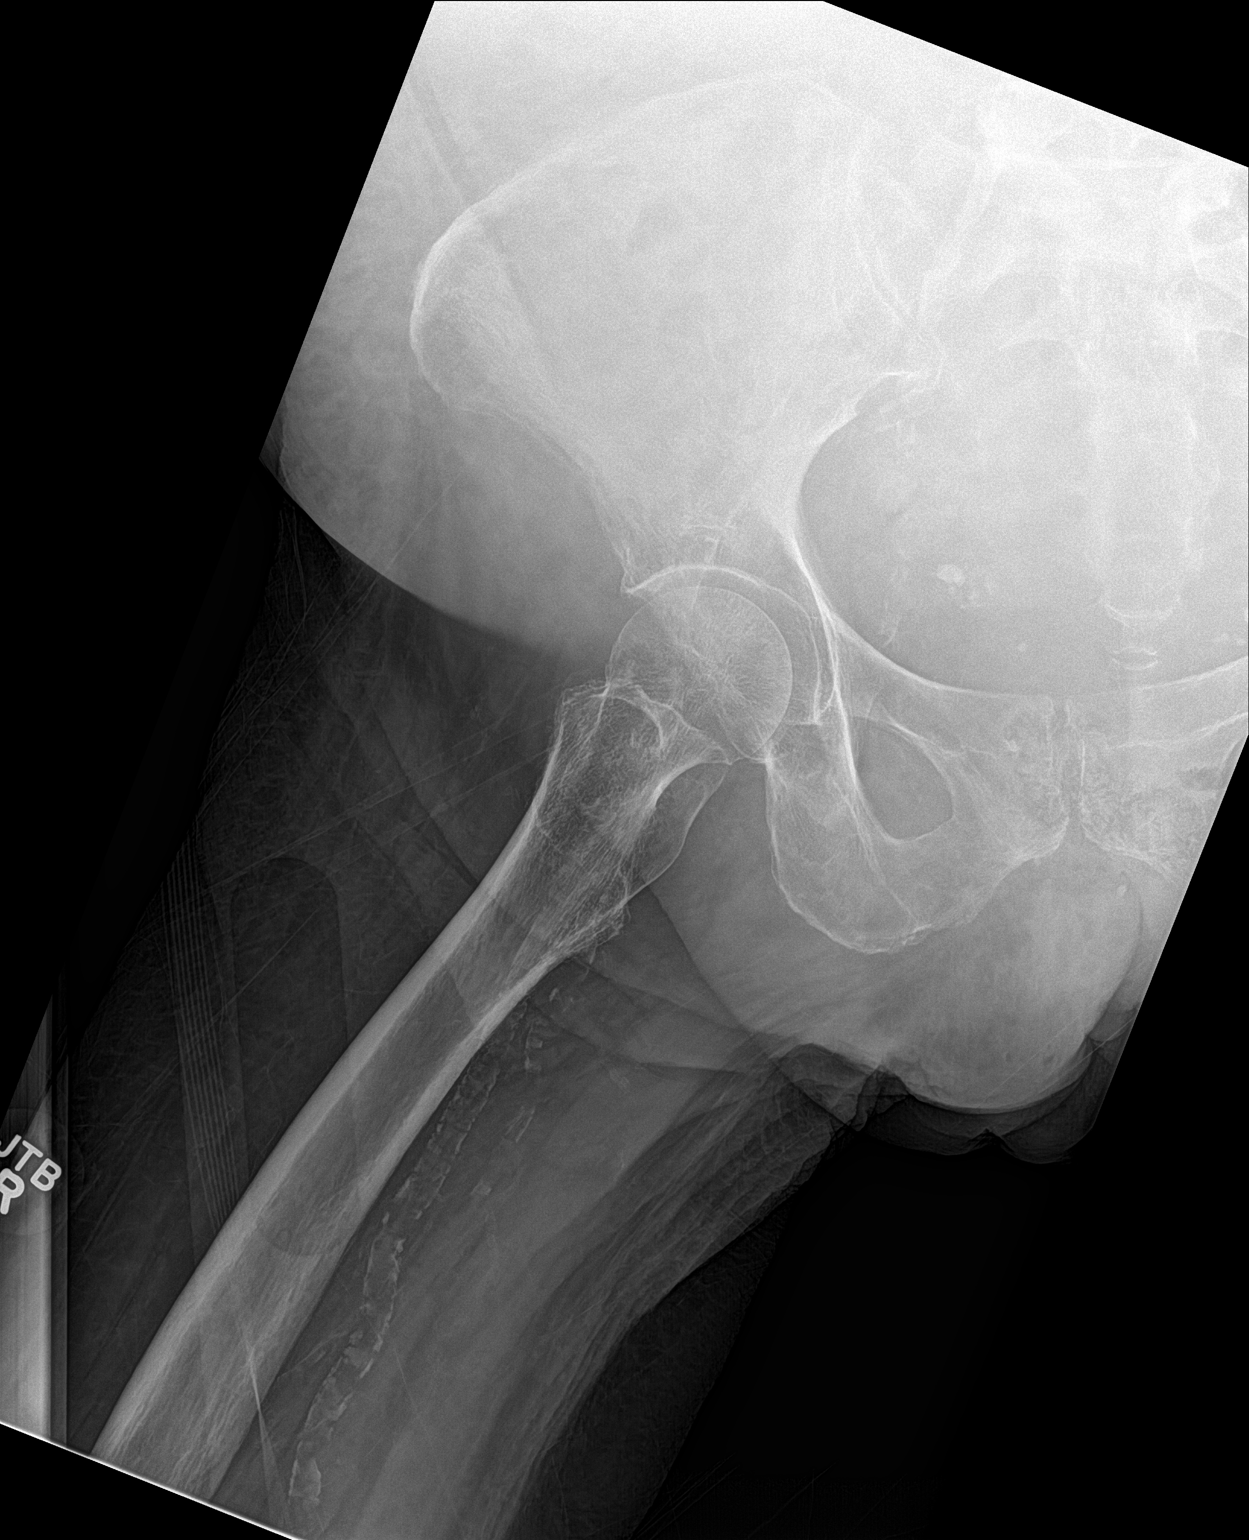

[3 of 3 positions shown; findings below may reference images not displayed]

FINDINGS: Osteopenia. Extensive aortic, iliac, and femoral artery calcified
atherosclerosis. Femoral heads are normally located. No pelvic
fracture identified. Proximal left femur appears grossly intact.
Proximal right femur appears intact. SI joints within normal limits.
IMPRESSION: No acute fracture or dislocation identified about the right hip per
pelvis.

If occult hip fracture is suspected or if the patient is unable to
weightbear, MRI is the preferred modality for further evaluation.

## 2017-01-16 IMAGING — CR DG CHEST 1V PORT
1 series · 1 of 1 positions shown · non-contrast
Comparison: 04/21/2015

CLINICAL DATA: Right PICC line placement

EXAM:
PORTABLE CHEST 1 VIEW

[AP]
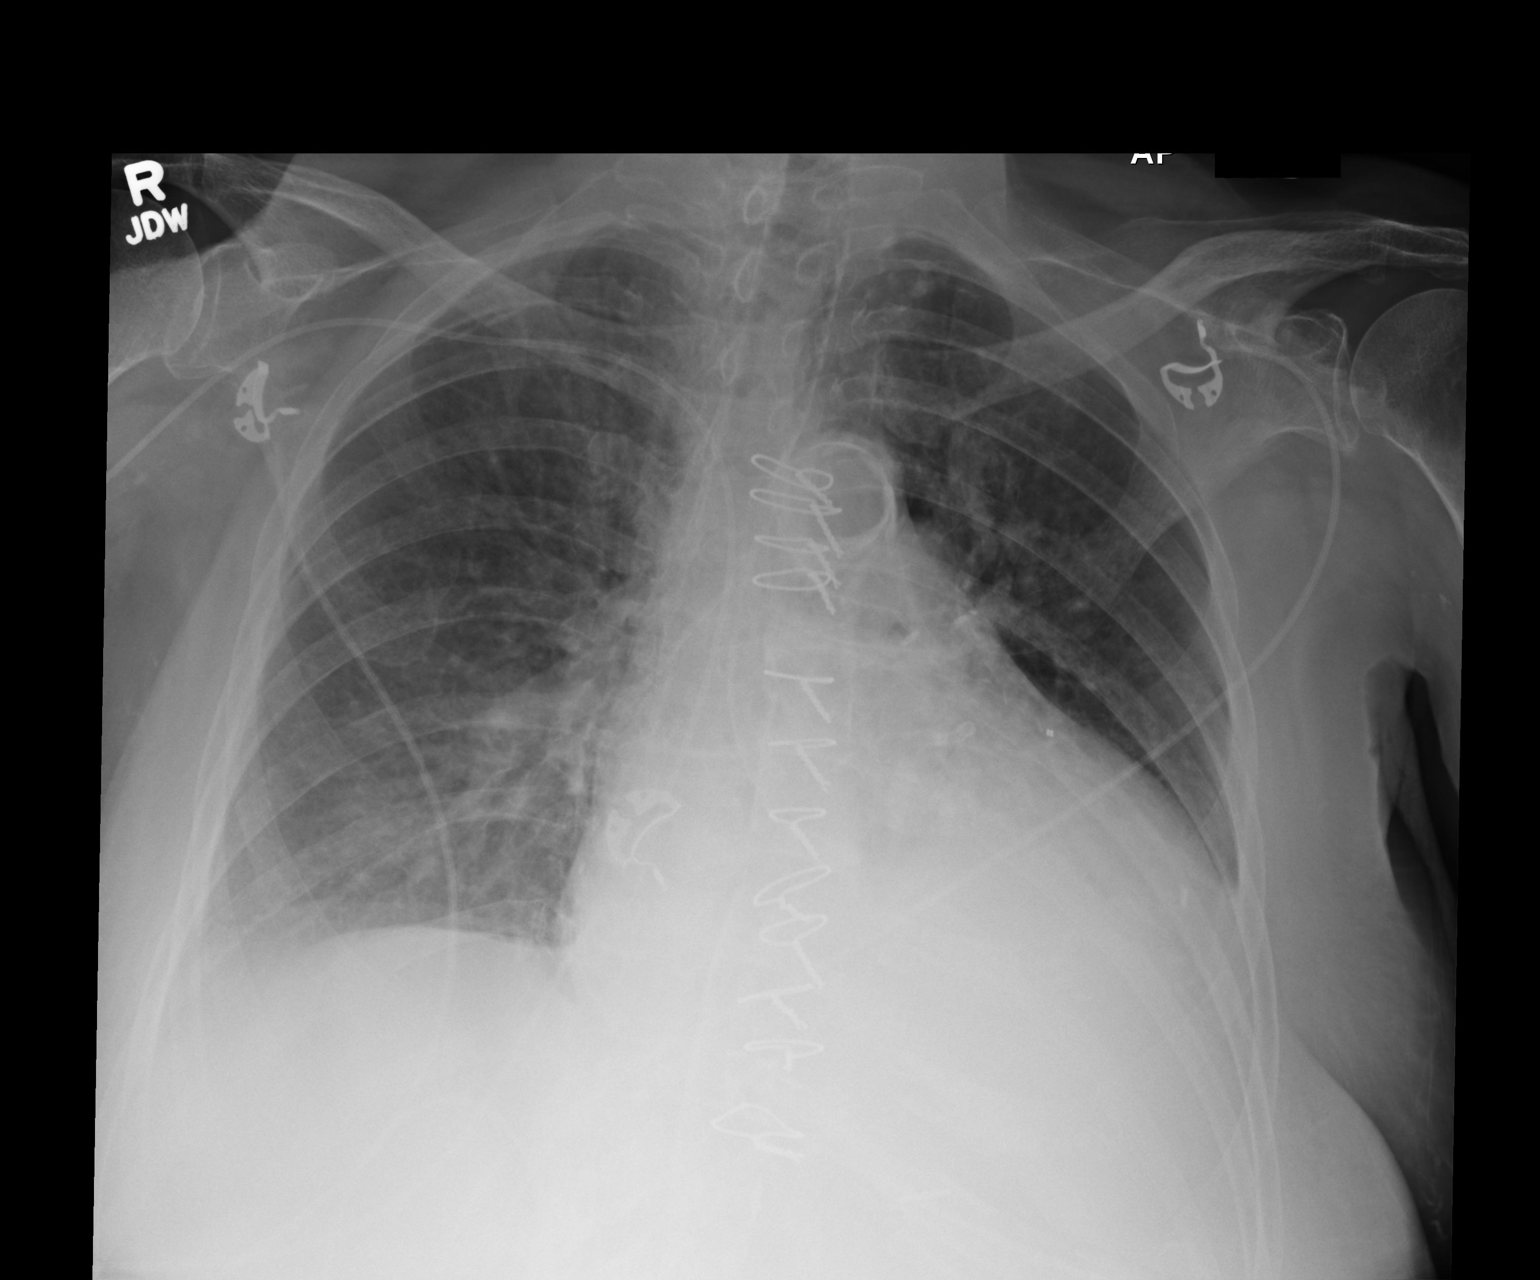

[1 of 1 positions shown; findings below may reference images not displayed]

FINDINGS: Cardiomegaly again noted. Status post median sternotomy.
Atherosclerotic calcifications of thoracic aorta again noted. Again
noted left basilar atelectasis. There is right arm PICC line with
tip in right atrium. No pneumothorax. For distal SVC position the
PICC line should be retracted about 2 cm. No infiltrate or pulmonary
edema.
IMPRESSION: Cardiomegaly again noted. No segmental infiltrate or pulmonary
edema.There is right arm PICC line with tip in right atrium. No
pneumothorax. For distal SVC position the PICC line should be
retracted about 2 cm.
